# Patient Record
Sex: Male | Born: 1990
Health system: Southern US, Community
[De-identification: ages and names within clinical notes are randomized; demographics above are authoritative.]

## PROBLEM LIST (undated history)

## (undated) DIAGNOSIS — K2970 Gastritis, unspecified, without bleeding: Secondary | ICD-10-CM

## (undated) DIAGNOSIS — K859 Acute pancreatitis without necrosis or infection, unspecified: Secondary | ICD-10-CM

---
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
## 2010-11-29 ENCOUNTER — Encounter: Payer: Self-pay | Admitting: *Deleted

## 2015-04-19 ENCOUNTER — Encounter (HOSPITAL_BASED_OUTPATIENT_CLINIC_OR_DEPARTMENT_OTHER): Payer: Self-pay

## 2015-04-19 ENCOUNTER — Emergency Department (HOSPITAL_BASED_OUTPATIENT_CLINIC_OR_DEPARTMENT_OTHER)
Admission: EM | Admit: 2015-04-19 | Discharge: 2015-04-19 | Disposition: A | Discharge status: Home / Self Care | Payer: Self-pay | Attending: Emergency Medicine | Admitting: Emergency Medicine

## 2015-04-19 DIAGNOSIS — R1013 Epigastric pain: Secondary | ICD-10-CM | POA: Insufficient documentation

## 2015-04-19 DIAGNOSIS — Z720 Tobacco use: Secondary | ICD-10-CM | POA: Insufficient documentation

## 2015-04-19 DIAGNOSIS — Z8719 Personal history of other diseases of the digestive system: Secondary | ICD-10-CM | POA: Insufficient documentation

## 2015-04-19 DIAGNOSIS — R112 Nausea with vomiting, unspecified: Secondary | ICD-10-CM | POA: Insufficient documentation

## 2015-04-19 HISTORY — DX: Gastritis, unspecified, without bleeding: K29.70

## 2015-04-19 HISTORY — DX: Acute pancreatitis without necrosis or infection, unspecified: K85.90

## 2015-04-19 LAB — COMPREHENSIVE METABOLIC PANEL
ALT: 21 U/L (ref 17–63)
AST: 24 U/L (ref 15–41)
Albumin: 4.4 g/dL (ref 3.5–5.0)
Alkaline Phosphatase: 58 U/L (ref 38–126)
Anion gap: 4 — ABNORMAL LOW (ref 5–15)
BUN: 8 mg/dL (ref 6–20)
CO2: 28 mmol/L (ref 22–32)
Calcium: 9.2 mg/dL (ref 8.9–10.3)
Chloride: 106 mmol/L (ref 101–111)
Creatinine, Ser: 0.88 mg/dL (ref 0.61–1.24)
GFR calc Af Amer: >60 mL/min (ref >60)
GFR calc non Af Amer: >60 mL/min (ref >60)
Glucose, Bld: 104 mg/dL — ABNORMAL HIGH (ref 65–99)
Potassium: 3.7 mmol/L (ref 3.5–5.1)
Sodium: 138 mmol/L (ref 135–145)
Total Bilirubin: 0.6 mg/dL (ref 0.3–1.2)
Total Protein: 7.3 g/dL (ref 6.5–8.1)

## 2015-04-19 LAB — LIPASE, BLOOD: Lipase: 32 U/L (ref 22–51)

## 2015-04-19 LAB — CBC WITH DIFFERENTIAL/PLATELET
Basophils Absolute: 0 10*3/uL (ref 0.0–0.1)
Basophils Relative: 0 % (ref 0–1)
Eosinophils Absolute: 0 10*3/uL (ref 0.0–0.7)
Eosinophils Relative: 0 % (ref 0–5)
HCT: 44.4 % (ref 39.0–52.0)
Hemoglobin: 14.8 g/dL (ref 13.0–17.0)
Lymphocytes Relative: 24 % (ref 12–46)
Lymphs Abs: 1.1 10*3/uL (ref 0.7–4.0)
MCH: 31.7 pg (ref 26.0–34.0)
MCHC: 33.3 g/dL (ref 30.0–36.0)
MCV: 95.1 fL (ref 78.0–100.0)
Monocytes Absolute: 0.4 10*3/uL (ref 0.1–1.0)
Monocytes Relative: 8 % (ref 3–12)
Neutro Abs: 3.2 10*3/uL (ref 1.7–7.7)
Neutrophils Relative %: 68 % (ref 43–77)
Platelets: 210 10*3/uL (ref 150–400)
RBC: 4.67 MIL/uL (ref 4.22–5.81)
RDW: 12.2 % (ref 11.5–15.5)
WBC: 4.8 10*3/uL (ref 4.0–10.5)

## 2015-04-19 MED ORDER — ONDANSETRON 4 MG PO TBDP: ORAL_TABLET | ORAL | Status: DC

## 2015-04-19 MED ORDER — SUCRALFATE 1 G PO TABS: 1.0000 g | ORAL_TABLET | Freq: Three times a day (TID) | ORAL | Status: DC

## 2015-04-19 MED ORDER — SODIUM CHLORIDE 0.9 % IV BOLUS (SEPSIS)
1000.0000 mL | Freq: Once | INTRAVENOUS | Status: AC
  Administered 2015-04-19: 1000 mL via INTRAVENOUS

## 2015-04-19 MED ORDER — OMEPRAZOLE 20 MG PO CPDR: 20.0000 mg | DELAYED_RELEASE_CAPSULE | Freq: Every day | ORAL | Status: DC

## 2015-04-19 MED ORDER — MORPHINE SULFATE 4 MG/ML IJ SOLN
4.0000 mg | Freq: Once | INTRAMUSCULAR | Status: AC
  Administered 2015-04-19: 4 mg via INTRAVENOUS
  Filled 2015-04-19: qty 1

## 2015-04-19 MED ORDER — PANTOPRAZOLE SODIUM 40 MG IV SOLR
40.0000 mg | Freq: Once | INTRAVENOUS | Status: AC
  Administered 2015-04-19: 40 mg via INTRAVENOUS
  Filled 2015-04-19: qty 40

## 2015-04-19 MED ORDER — ONDANSETRON HCL 4 MG/2ML IJ SOLN
4.0000 mg | Freq: Once | INTRAMUSCULAR | Status: AC
  Administered 2015-04-19: 4 mg via INTRAVENOUS
  Filled 2015-04-19: qty 2

## 2015-04-19 NOTE — ED Notes (Signed)
MD at bedside to discuss results of testing. 

## 2015-04-19 NOTE — ED Notes (Signed)
MD at bedside.  Family now at bedside voicing concerns over "finding out what the problem is and why he keeps getting this".  Pt states has been seen by gi, had u/s, ct scan and endoscopy and colonoscopy and states all testing was negative.  States that he was informed people typically get this with chronic alcoholism but states he does not drink and appears offended by this previous assessment per mds.  Informed will r/o acute issues and will then need to follow back up with gi for additional testing.

## 2015-04-19 NOTE — Discharge Instructions (Signed)

## 2015-04-19 NOTE — ED Notes (Addendum)
Pt reports acute on chronic abd pain, n/v/d.  Denies fever.  Reports hx of pancreatitis, gastritis.  Reports has been seen at [facility], given meds, does not have with him currently.

## 2015-04-19 NOTE — ED Provider Notes (Signed)
CSN: [id number]     Arrival date & time 04/19/15  0902 History   First MD Initiated Contact with Patient 04/19/15 [phone]     Chief Complaint  Patient presents with  . Abdominal Pain     (Consider location/radiation/quality/duration/timing/severity/associated sxs/prior Treatment) HPI Comments: Patient presents with abdominal pain. He has a history of prior pancreatitis and gastritis. He states every couple months he has a flareup of epigastric pain. He states his pain started again this morning with constant throbbing pain in his upper abdomen. He's had associated nausea and vomiting. He states she's having normal stools. He denies any fevers or chills. His emesis is nonbloody and nonbilious. He's been evaluated extensively for this in the past. He states he's had a CT scan and endoscopy and ultrasound. He has seen a gastroenterologist in [facility]. He states he's usually been seen at [facility] for his symptoms.  Patient is a 24 y.o. male presenting with abdominal pain.  Abdominal Pain Associated symptoms: nausea and vomiting   Associated symptoms: no chest pain, no chills, no cough, no diarrhea, no fatigue, no fever, no hematuria and no shortness of breath     Past Medical History  Diagnosis Date  . Gastritis   . Pancreatitis    History reviewed. No pertinent past surgical history. No family history on file. History  Substance Use Topics  . Smoking status: Current Every Day Smoker -- 0.50 packs/day    Types: Cigarettes  . Smokeless tobacco: Not on file  . Alcohol Use: No    Review of Systems  Constitutional: Negative for fever, chills, diaphoresis and fatigue.  HENT: Negative for congestion, rhinorrhea and sneezing.   Eyes: Negative.   Respiratory: Negative for cough, chest tightness and shortness of breath.   Cardiovascular: Negative for chest pain and leg swelling.  Gastrointestinal: Positive for nausea, vomiting and abdominal pain. Negative for diarrhea and  blood in stool.  Genitourinary: Negative for frequency, hematuria, flank pain and difficulty urinating.  Musculoskeletal: Negative for back pain and arthralgias.  Skin: Negative for rash.  Neurological: Negative for dizziness, speech difficulty, weakness, numbness and headaches.      Allergies  Review of patient's allergies indicates no known allergies.  Home Medications   Prior to Admission medications   Medication Sig Start Date End Date Taking? Authorizing Provider  omeprazole (PRILOSEC) 20 MG capsule Take 1 capsule (20 mg total) by mouth daily. 04/19/15   Rolan Bucco, MD  ondansetron (ZOFRAN ODT) 4 MG disintegrating tablet 4mg  ODT q4 hours prn nausea/vomit 04/19/15   Rolan Bucco, MD  sucralfate (CARAFATE) 1 G tablet Take 1 tablet (1 g total) by mouth 4 (four) times daily -  with meals and at bedtime. 04/19/15   Rolan Bucco, MD   BP 137/49 mmHg  Pulse 77  Temp(Src) 98.4 F (36.9 C) (Oral)  Resp 16  Ht 6\' 3"  (1.905 m)  Wt 160 lb (72.576 kg)  BMI 20.00 kg/m2  SpO2 100% Physical Exam  Constitutional: He is oriented to person, place, and time. He appears well-developed and well-nourished.  HENT:  Head: Normocephalic and atraumatic.  Eyes: Pupils are equal, round, and reactive to light.  Neck: Normal range of motion. Neck supple.  Cardiovascular: Normal rate, regular rhythm and normal heart sounds.   Pulmonary/Chest: Effort normal and breath sounds normal. No respiratory distress. He has no wheezes. He has no rales. He exhibits no tenderness.  Abdominal: Soft. Bowel sounds are normal. There is tenderness (Moderate tenderness to epigastrium). There  is no rebound and no guarding.  Musculoskeletal: Normal range of motion. He exhibits no edema.  Lymphadenopathy:    He has no cervical adenopathy.  Neurological: He is alert and oriented to person, place, and time.  Skin: Skin is warm and dry. No rash noted.  Psychiatric: He has a normal mood and affect.    ED Course   Procedures (including critical care time) Labs Review Results for orders placed or performed during the hospital encounter of 04/19/15  Comprehensive metabolic panel  Result Value Ref Range   Sodium 138 135 - 145 mmol/L   Potassium 3.7 3.5 - 5.1 mmol/L   Chloride 106 101 - 111 mmol/L   CO2 28 22 - 32 mmol/L   Glucose, Bld 104 (H) 65 - 99 mg/dL   BUN 8 6 - 20 mg/dL   Creatinine, Ser 1.61 0.61 - 1.24 mg/dL   Calcium 9.2 8.9 - 09.6 mg/dL   Total Protein 7.3 6.5 - 8.1 g/dL   Albumin 4.4 3.5 - 5.0 g/dL   AST 24 15 - 41 U/L   ALT 21 17 - 63 U/L   Alkaline Phosphatase 58 38 - 126 U/L   Total Bilirubin 0.6 0.3 - 1.2 mg/dL   GFR calc non Af Amer >60 >60 mL/min   GFR calc Af Amer >60 >60 mL/min   Anion gap 4 (L) 5 - 15  CBC with Differential  Result Value Ref Range   WBC 4.8 4.0 - 10.5 K/uL   RBC 4.67 4.22 - 5.81 MIL/uL   Hemoglobin 14.8 13.0 - 17.0 g/dL   HCT 04.5 40.9 - 81.1 %   MCV 95.1 78.0 - 100.0 fL   MCH 31.7 26.0 - 34.0 pg   MCHC 33.3 30.0 - 36.0 g/dL   RDW 91.4 78.2 - 95.6 %   Platelets 210 150 - 400 K/uL   Neutrophils Relative % 68 43 - 77 %   Neutro Abs 3.2 1.7 - 7.7 K/uL   Lymphocytes Relative 24 12 - 46 %   Lymphs Abs 1.1 0.7 - 4.0 K/uL   Monocytes Relative 8 3 - 12 %   Monocytes Absolute 0.4 0.1 - 1.0 K/uL   Eosinophils Relative 0 0 - 5 %   Eosinophils Absolute 0.0 0.0 - 0.7 K/uL   Basophils Relative 0 0 - 1 %   Basophils Absolute 0.0 0.0 - 0.1 K/uL  Lipase, blood  Result Value Ref Range   Lipase 32 22 - 51 U/L   No results found.    Imaging Review No results found.   EKG Interpretation None      MDM   Final diagnoses:  Epigastric pain    Patient's labs are unremarkable. There is no evidence of acute pancreatitis. He's had a prior workup afford to assess for gallbladder disease. He's had a prior CT scan of his abdomen pelvis. I don't feel this point that further imaging studies are indicated. He has no ongoing vomiting. He was given IV fluids and  some pain medicines in the emergency department. He's feeling better and appears to be very comfortable. His abdominal exam is non-concerning. He is requesting a referral to a another gastroenterologist. I gave him an outpatient referral to look our gastroenterology. I gave him prescription for Prilosec Carafate and Zofran. Return precautions were given.    Rolan Bucco, MD 04/19/15 1047

## 2015-07-02 ENCOUNTER — Encounter (HOSPITAL_BASED_OUTPATIENT_CLINIC_OR_DEPARTMENT_OTHER): Payer: Self-pay

## 2015-07-02 ENCOUNTER — Emergency Department (HOSPITAL_BASED_OUTPATIENT_CLINIC_OR_DEPARTMENT_OTHER)
Admission: EM | Admit: 2015-07-02 | Discharge: 2015-07-02 | Disposition: A | Discharge status: Home / Self Care | Payer: Self-pay | Attending: Emergency Medicine | Admitting: Emergency Medicine

## 2015-07-02 DIAGNOSIS — R1013 Epigastric pain: Secondary | ICD-10-CM | POA: Insufficient documentation

## 2015-07-02 DIAGNOSIS — K2970 Gastritis, unspecified, without bleeding: Secondary | ICD-10-CM | POA: Insufficient documentation

## 2015-07-02 DIAGNOSIS — R112 Nausea with vomiting, unspecified: Secondary | ICD-10-CM | POA: Insufficient documentation

## 2015-07-02 DIAGNOSIS — R1012 Left upper quadrant pain: Secondary | ICD-10-CM | POA: Insufficient documentation

## 2015-07-02 DIAGNOSIS — Z79899 Other long term (current) drug therapy: Secondary | ICD-10-CM | POA: Insufficient documentation

## 2015-07-02 DIAGNOSIS — Z720 Tobacco use: Secondary | ICD-10-CM | POA: Insufficient documentation

## 2015-07-02 LAB — CBC WITH DIFFERENTIAL/PLATELET
BASOS ABS: 0 10*3/uL (ref 0.0–0.1)
Basophils Relative: 0 % (ref 0–1)
EOS ABS: 0 10*3/uL (ref 0.0–0.7)
Eosinophils Relative: 1 % (ref 0–5)
HCT: 44.7 % (ref 39.0–52.0)
Hemoglobin: 15.2 g/dL (ref 13.0–17.0)
LYMPHS PCT: 17 % (ref 12–46)
Lymphs Abs: 1.1 10*3/uL (ref 0.7–4.0)
MCH: 32.1 pg (ref 26.0–34.0)
MCHC: 34 g/dL (ref 30.0–36.0)
MCV: 94.5 fL (ref 78.0–100.0)
Monocytes Absolute: 0.4 10*3/uL (ref 0.1–1.0)
Monocytes Relative: 7 % (ref 3–12)
Neutro Abs: 4.5 10*3/uL (ref 1.7–7.7)
Neutrophils Relative %: 75 % (ref 43–77)
PLATELETS: 194 10*3/uL (ref 150–400)
RBC: 4.73 MIL/uL (ref 4.22–5.81)
RDW: 11.9 % (ref 11.5–15.5)
WBC: 6.1 10*3/uL (ref 4.0–10.5)

## 2015-07-02 LAB — COMPREHENSIVE METABOLIC PANEL
ALT: 17 U/L (ref 17–63)
AST: 19 U/L (ref 15–41)
Albumin: 4.5 g/dL (ref 3.5–5.0)
Alkaline Phosphatase: 53 U/L (ref 38–126)
Anion gap: 9 (ref 5–15)
BUN: 11 mg/dL (ref 6–20)
CHLORIDE: 101 mmol/L (ref 101–111)
CO2: 29 mmol/L (ref 22–32)
Calcium: 9.9 mg/dL (ref 8.9–10.3)
Creatinine, Ser: 0.85 mg/dL (ref 0.61–1.24)
GFR calc Af Amer: >60 mL/min (ref >60)
GFR calc non Af Amer: >60 mL/min (ref >60)
Glucose, Bld: 91 mg/dL (ref 65–99)
Potassium: 4.1 mmol/L (ref 3.5–5.1)
SODIUM: 139 mmol/L (ref 135–145)
Total Bilirubin: 1 mg/dL (ref 0.3–1.2)
Total Protein: 7.7 g/dL (ref 6.5–8.1)

## 2015-07-02 LAB — URINALYSIS, ROUTINE W REFLEX MICROSCOPIC
Bilirubin Urine: NEGATIVE
GLUCOSE, UA: NEGATIVE mg/dL
Hgb urine dipstick: NEGATIVE
Ketones, ur: 15 mg/dL — AB
LEUKOCYTES UA: NEGATIVE
Nitrite: NEGATIVE
PH: 7 (ref 5.0–8.0)
PROTEIN: NEGATIVE mg/dL
Specific Gravity, Urine: 1.024 (ref 1.005–1.030)
Urobilinogen, UA: 1 mg/dL (ref 0.0–1.0)

## 2015-07-02 LAB — LIPASE, BLOOD: Lipase: 55 U/L — ABNORMAL HIGH (ref 22–51)

## 2015-07-02 MED ORDER — SUCRALFATE 1 G PO TABS: 1.0000 g | ORAL_TABLET | Freq: Three times a day (TID) | ORAL | Status: DC

## 2015-07-02 MED ORDER — GI COCKTAIL ~~LOC~~
30.0000 mL | Freq: Once | ORAL | Status: AC
  Administered 2015-07-02: 30 mL via ORAL
  Filled 2015-07-02: qty 30

## 2015-07-02 MED ORDER — OMEPRAZOLE 20 MG PO CPDR: DELAYED_RELEASE_CAPSULE | ORAL | Status: DC

## 2015-07-02 NOTE — Discharge Instructions (Signed)
Please read and follow all provided instructions.  Your diagnoses today include:  1. Epigastric abdominal pain     Tests performed today include:  Blood counts and electrolytes  Blood tests to check liver and kidney function  Blood tests to check pancreas function  Urine test to look for infection  Vital signs. See below for your results today.   Medications prescribed:   Carafate - for stomach upset and to protect your stomach   Omeprazole (Prilosec) - stomach acid reducer  This medication can be found over-the-counter  Take any prescribed medications only as directed.  Home care instructions:   Follow any educational materials contained in this packet.  Follow-up instructions: Please follow-up with your primary care provider in the next 7 days for further evaluation of your symptoms.    Return instructions:  SEEK IMMEDIATE MEDICAL ATTENTION IF:  The pain does not go away or becomes severe   A temperature above 101F develops   Repeated vomiting occurs (multiple episodes)   The pain becomes localized to portions of the abdomen. The right side could possibly be appendicitis. In an adult, the left lower portion of the abdomen could be colitis or diverticulitis.   Blood is being passed in stools or vomit (bright red or black tarry stools)   You develop chest pain, difficulty breathing, dizziness or fainting, or become confused, poorly responsive, or inconsolable (young children)  If you have any other emergent concerns regarding your health  Additional Information: Abdominal (belly) pain can be caused by many things. Your caregiver performed an examination and possibly ordered blood/urine tests and imaging (CT scan, x-rays, ultrasound). Many cases can be observed and treated at home after initial evaluation in the emergency department. Even though you are being discharged home, abdominal pain can be unpredictable. Therefore, you need a repeated exam if your pain  does not resolve, returns, or worsens. Most patients with abdominal pain don't have to be admitted to the hospital or have surgery, but serious problems like appendicitis and gallbladder attacks can start out as nonspecific pain. Many abdominal conditions cannot be diagnosed in one visit, so follow-up evaluations are very important.  Your vital signs today were: BP 130/58 mmHg   Pulse 66   Temp(Src) 98.2 F (36.8 C) (Oral)   Resp 18   Ht  (1.88 m)   Wt 151 lb (68.493 kg)   BMI 19.38 kg/m2   SpO2 100% If your blood pressure (bp) was elevated above 135/85 this visit, please have this repeated by your doctor within one month. --------------

## 2015-07-02 NOTE — ED Notes (Signed)
C/o abd pain, vomiting since Saturday-states emesis was "black today"

## 2015-07-02 NOTE — ED Provider Notes (Signed)
CSN: [id number]     Arrival date & time 07/02/15  1200 History   First MD Initiated Contact with Patient 07/02/15 1228     Chief Complaint  Patient presents with  . Abdominal Pain     (Consider location/radiation/quality/duration/timing/severity/associated sxs/prior Treatment) HPI Comments: Patient with history of pancreatitis and gastritis presents with his usual epigastric and left upper quadrant abdominal pain consistent with previous episodes of gastritis. Patient has been seen by a gastroenterologist in [facility] in the past. He has had a colonoscopy and EGD which were unrevealing. Patient states that he has had intermittent episodes of vomiting over the past 4 days. Today his emesis was "black". Patient has been taking omeprazole, Zofran, and Carafate for his symptoms. He states that when he is feeling better he does not take the omeprazole every day. He denies any change in his stools, melanotic or black tarry stools, gross blood in stools. Pain does not radiate. Patient denies alcohol use. He avoids NSAIDs. Patient does smoke. No lightheadedness or dizziness. The onset of this condition was acute. The course is constant. Aggravating factors: none. Alleviating factors: none.    Patient is a 24 y.o. male presenting with abdominal pain. The history is provided by the patient.  Abdominal Pain Associated symptoms: nausea and vomiting   Associated symptoms: no chest pain, no cough, no diarrhea, no dysuria, no fever and no sore throat     Past Medical History  Diagnosis Date  . Gastritis   . Pancreatitis    History reviewed. No pertinent past surgical history. No family history on file. Social History  Substance Use Topics  . Smoking status: Current Every Day Smoker -- 0.50 packs/day    Types: Cigarettes  . Smokeless tobacco: None  . Alcohol Use: No    Review of Systems  Constitutional: Negative for fever.  HENT: Negative for rhinorrhea and sore throat.   Eyes: Negative for  redness.  Respiratory: Negative for cough.   Cardiovascular: Negative for chest pain.  Gastrointestinal: Positive for nausea, vomiting and abdominal pain. Negative for diarrhea and blood in stool.  Genitourinary: Negative for dysuria.  Musculoskeletal: Negative for myalgias.  Skin: Negative for rash.  Neurological: Negative for light-headedness and headaches.      Allergies  Review of patient's allergies indicates no known allergies.  Home Medications   Prior to Admission medications   Medication Sig Start Date End Date Taking? Authorizing Provider  omeprazole (PRILOSEC) 20 MG capsule Take 1 capsule (20 mg total) by mouth daily. 04/19/15   Rolan Bucco, MD  ondansetron (ZOFRAN ODT) 4 MG disintegrating tablet 4mg  ODT q4 hours prn nausea/vomit 04/19/15   Rolan Bucco, MD  sucralfate (CARAFATE) 1 G tablet Take 1 tablet (1 g total) by mouth 4 (four) times daily -  with meals and at bedtime. 04/19/15   Rolan Bucco, MD   BP 130/58 mmHg  Pulse 66  Temp(Src) 98.2 F (36.8 C) (Oral)  Resp 18  Ht 6\' 2"  (1.88 m)  Wt 151 lb (68.493 kg)  BMI 19.38 kg/m2  SpO2 100% Physical Exam  Constitutional: He appears well-developed and well-nourished.  HENT:  Head: Normocephalic and atraumatic.  Eyes: Conjunctivae are normal. Right eye exhibits no discharge. Left eye exhibits no discharge.  Neck: Normal range of motion. Neck supple.  Cardiovascular: Normal rate, regular rhythm and normal heart sounds.   Pulmonary/Chest: Effort normal and breath sounds normal.  Abdominal: Soft. Bowel sounds are normal. There is tenderness (Mild) in the epigastric area and left upper  quadrant. There is no rigidity, no rebound, no guarding, no CVA tenderness, no tenderness at McBurney's point and negative Murphy's sign.  Neurological: He is alert.  Skin: Skin is warm and dry.  Psychiatric: He has a normal mood and affect.  Nursing note and vitals reviewed.   ED Course  Procedures (including critical care  time) Labs Review Labs Reviewed  LIPASE, BLOOD - Abnormal; Notable for the following:    Lipase 55 (*)    All other components within normal limits  URINALYSIS, ROUTINE W REFLEX MICROSCOPIC (NOT AT [facility]) - Abnormal; Notable for the following:    Ketones, ur 15 (*)    All other components within normal limits  CBC WITH DIFFERENTIAL/PLATELET  COMPREHENSIVE METABOLIC PANEL    Imaging Review No results found. I have personally reviewed and evaluated these images and lab results as part of my medical decision-making.   EKG Interpretation None      1:16 PM Patient seen and examined. Work-up initiated. Medications ordered.   Vital signs reviewed and are as follows: BP 130/58 mmHg  Pulse 66  Temp(Src) 98.2 F (36.8 C) (Oral)  Resp 18  Ht  (1.88 m)  Wt 151 lb (68.493 kg)  BMI 19.38 kg/m2  SpO2 100%  2:03 PM reviewed lab findings with patient. Patient had significant relief from GI cocktail.  I encouraged the patient to take omeprazole every day regardless of whether he is feeling better or not. He is to use Carafate and Zofran as needed. Discussed smoking cessation with patient. Encouraged PCP follow-up in one week for recheck.  The patient was urged to return to the Emergency Department immediately with worsening of current symptoms, worsening abdominal pain, persistent vomiting, blood noted in stools, fever, or any other concerns. The patient verbalized understanding.    MDM   Final diagnoses:  Epigastric abdominal pain   Patient with recurring bouts of epigastric pain consistent with previous gastritis. Unclear inciting etiology. Labs today are reassuring. Symptoms resolved with GI cocktail. Patient with very mild elevation in lipase which I do not think is due to pancreatitis. Treatment as above. Do not feel that CT imaging is indicated at this time. Symptoms are not clinically suspicious for gallbladder disease. Patient appears well, nontoxic. Discussed return  instructions with patient as above.    Renne Crigler, PA-C 07/02/15 1404  Mirian Mo, MD 07/03/15 [phone]

## 2015-09-12 ENCOUNTER — Encounter (HOSPITAL_BASED_OUTPATIENT_CLINIC_OR_DEPARTMENT_OTHER): Payer: Self-pay | Admitting: *Deleted

## 2015-09-12 ENCOUNTER — Emergency Department (HOSPITAL_BASED_OUTPATIENT_CLINIC_OR_DEPARTMENT_OTHER)
Admission: EM | Admit: 2015-09-12 | Discharge: 2015-09-12 | Disposition: A | Discharge status: Home / Self Care | Payer: Self-pay | Attending: Emergency Medicine | Admitting: Emergency Medicine

## 2015-09-12 DIAGNOSIS — K2970 Gastritis, unspecified, without bleeding: Secondary | ICD-10-CM | POA: Insufficient documentation

## 2015-09-12 DIAGNOSIS — Z720 Tobacco use: Secondary | ICD-10-CM | POA: Insufficient documentation

## 2015-09-12 DIAGNOSIS — G8929 Other chronic pain: Secondary | ICD-10-CM | POA: Insufficient documentation

## 2015-09-12 LAB — COMPREHENSIVE METABOLIC PANEL
ALT: 15 U/L — ABNORMAL LOW (ref 17–63)
ANION GAP: 5 (ref 5–15)
AST: 21 U/L (ref 15–41)
Albumin: 4.4 g/dL (ref 3.5–5.0)
Alkaline Phosphatase: 55 U/L (ref 38–126)
BILIRUBIN TOTAL: 0.7 mg/dL (ref 0.3–1.2)
BUN: 12 mg/dL (ref 6–20)
CHLORIDE: 100 mmol/L — AB (ref 101–111)
CO2: 29 mmol/L (ref 22–32)
Calcium: 9.3 mg/dL (ref 8.9–10.3)
Creatinine, Ser: 0.75 mg/dL (ref 0.61–1.24)
GFR calc Af Amer: >60 mL/min (ref >60)
Glucose, Bld: 95 mg/dL (ref 65–99)
Potassium: 4.1 mmol/L (ref 3.5–5.1)
Sodium: 134 mmol/L — ABNORMAL LOW (ref 135–145)
TOTAL PROTEIN: 7.5 g/dL (ref 6.5–8.1)

## 2015-09-12 LAB — CBC WITH DIFFERENTIAL/PLATELET
BASOS PCT: 0 %
Basophils Absolute: 0 10*3/uL (ref 0.0–0.1)
EOS PCT: 1 %
Eosinophils Absolute: 0 10*3/uL (ref 0.0–0.7)
HCT: 42 % (ref 39.0–52.0)
Hemoglobin: 14.3 g/dL (ref 13.0–17.0)
LYMPHS PCT: 26 %
Lymphs Abs: 1.5 10*3/uL (ref 0.7–4.0)
MCH: 31.8 pg (ref 26.0–34.0)
MCHC: 34 g/dL (ref 30.0–36.0)
MCV: 93.3 fL (ref 78.0–100.0)
Monocytes Absolute: 0.6 10*3/uL (ref 0.1–1.0)
Monocytes Relative: 10 %
Neutro Abs: 3.7 10*3/uL (ref 1.7–7.7)
Neutrophils Relative %: 63 %
PLATELETS: 235 10*3/uL (ref 150–400)
RBC: 4.5 MIL/uL (ref 4.22–5.81)
RDW: 12.5 % (ref 11.5–15.5)
WBC: 5.8 10*3/uL (ref 4.0–10.5)

## 2015-09-12 LAB — URINALYSIS, ROUTINE W REFLEX MICROSCOPIC
Bilirubin Urine: NEGATIVE
Glucose, UA: NEGATIVE mg/dL
HGB URINE DIPSTICK: NEGATIVE
KETONES UR: 15 mg/dL — AB
Leukocytes, UA: NEGATIVE
NITRITE: NEGATIVE
PH: 7 (ref 5.0–8.0)
PROTEIN: NEGATIVE mg/dL
Specific Gravity, Urine: 1.024 (ref 1.005–1.030)
Urobilinogen, UA: 1 mg/dL (ref 0.0–1.0)

## 2015-09-12 LAB — LIPASE, BLOOD: Lipase: 83 U/L — ABNORMAL HIGH (ref 11–51)

## 2015-09-12 MED ORDER — OMEPRAZOLE 20 MG PO CPDR: 20.0000 mg | DELAYED_RELEASE_CAPSULE | Freq: Two times a day (BID) | ORAL | Status: DC

## 2015-09-12 MED ORDER — GI COCKTAIL ~~LOC~~
30.0000 mL | Freq: Once | ORAL | Status: AC
  Administered 2015-09-12: 30 mL via ORAL
  Filled 2015-09-12: qty 30

## 2015-09-12 MED ORDER — ONDANSETRON 4 MG PO TBDP: 4.0000 mg | ORAL_TABLET | Freq: Three times a day (TID) | ORAL | Status: DC | PRN

## 2015-09-12 MED ORDER — SUCRALFATE 1 GM/10ML PO SUSP: 1.0000 g | Freq: Three times a day (TID) | ORAL | Status: DC

## 2015-09-12 MED ORDER — ONDANSETRON 4 MG PO TBDP
4.0000 mg | ORAL_TABLET | Freq: Once | ORAL | Status: AC
  Administered 2015-09-12: 4 mg via ORAL
  Filled 2015-09-12: qty 1

## 2015-09-12 NOTE — ED Notes (Signed)
Patient verbalizes understanding of discharge instructions and medications.  Patient stable and ambulatory.

## 2015-09-12 NOTE — ED Provider Notes (Signed)
CSN: [id number]     Arrival date & time 09/12/15  1353 History   First MD Initiated Contact with Patient 09/12/15 1551     Chief Complaint  Patient presents with  . Abdominal Pain    HPI   24 year old male with a history of pancreatitis and gastritis presents today with typical epigastric pain, nausea, vomiting. Patient reports he's had this for "many years" with significant workup including gastroenterology with endoscopy, colonoscopy, H. pylori testing, CT and ultrasounds. She reports he continues to be diagnosed with gastritis, as they continued to find no abnormalities on any further diagnostic studies. Patient reports current symptoms have been present for the last 4 days with nausea, vomiting, and no appetite. Patient reports symptoms are made worse with food, he is been unable to tolerate solid foods, sticking to a liquid diet since the onset. Patient reports symptoms usually presented in similar fashion, he seen in the emergency room given a GI cocktail which seems to improve symptoms which she is able to manage on the outpatient basis. These keep reoccurring, he denies any drug, alcohol, ibuprofen use, high fatty foods or abnormal diets, or any other precipitating factors. Patient denies any dark or bloody stools, lower abdominal pain, fever, chills, altered mental status, chest pain, back pain, changes in the color clarity or characteristics of his urine or bowel movements. Patient reports that he has oral Zofran at home but is unable to take it because it causes him to throw up. He also has Carafate which does the same.  Past Medical History  Diagnosis Date  . Gastritis   . Pancreatitis    History reviewed. No pertinent past surgical history. No family history on file. Social History  Substance Use Topics  . Smoking status: Current Every Day Smoker -- 0.50 packs/day    Types: Cigarettes  . Smokeless tobacco: None  . Alcohol Use: No    Review of Systems  All other systems  reviewed and are negative.     Allergies  Review of patient's allergies indicates no known allergies.  Home Medications   Prior to Admission medications   Medication Sig Start Date End Date Taking? Authorizing Provider  omeprazole (PRILOSEC) 20 MG capsule Take 1 capsule (20 mg total) by mouth 2 (two) times daily before a meal. 09/12/15   Eyvonne Mechanic, PA-C  ondansetron (ZOFRAN ODT) 4 MG disintegrating tablet Take 1 tablet (4 mg total) by mouth every 8 (eight) hours as needed for nausea or vomiting. 09/12/15   Eyvonne Mechanic, PA-C  sucralfate (CARAFATE) 1 GM/10ML suspension Take 10 mLs (1 g total) by mouth 4 (four) times daily -  with meals and at bedtime. 09/12/15   Amario Longmore, PA-C   BP 128/72 mmHg  Pulse 62  Temp(Src) 97.7 F (36.5 C) (Oral)  Resp 18  Ht  (1.905 m)  Wt 151 lb (68.493 kg)  BMI 18.87 kg/m2  SpO2 99%   Physical Exam  Constitutional: He is oriented to person, place, and time. He appears well-developed and well-nourished.  HENT:  Head: Normocephalic and atraumatic.  Eyes: Conjunctivae are normal. Pupils are equal, round, and reactive to light. Right eye exhibits no discharge. Left eye exhibits no discharge. No scleral icterus.  Neck: Normal range of motion. No JVD present. No tracheal deviation present.  Pulmonary/Chest: Effort normal. No stridor.  Abdominal: Soft. Bowel sounds are normal. He exhibits no distension and no mass. There is tenderness. There is no rebound and no guarding.  Tenderness to the epigastric region  Neurological: He is alert and oriented to person, place, and time. Coordination normal.  Psychiatric: He has a normal mood and affect. His behavior is normal. Judgment and thought content normal.  Nursing note and vitals reviewed.   ED Course  Procedures (including critical care time) Labs Review Labs Reviewed  URINALYSIS, ROUTINE W REFLEX MICROSCOPIC (NOT AT [facility]) - Abnormal; Notable for the following:    Ketones, ur 15 (*)    All  other components within normal limits  COMPREHENSIVE METABOLIC PANEL - Abnormal; Notable for the following:    Sodium 134 (*)    Chloride 100 (*)    ALT 15 (*)    All other components within normal limits  LIPASE, BLOOD - Abnormal; Notable for the following:    Lipase 83 (*)    All other components within normal limits  CBC WITH DIFFERENTIAL/PLATELET    Imaging Review No results found. I have personally reviewed and evaluated these images and lab results as part of my medical decision-making.   EKG Interpretation None      MDM   Final diagnoses:  Gastritis    Labs: CBC, CMP, urinalysis, lipase- no significant findings  Imaging:  Consults:  Therapeutics: Zofran, GI cocktail  Discharge Meds: ODT Zofran  Assessment/Plan: Patient presents with chronic abdominal pain. Patient is having a flare today, no significant findings on labs that would indicate acute pancreatitis, patient's pain was significantly improved with GI cocktail here in the ED. Patient has had significant workup for this in the past, he will be encouraged follow-up with his GI specialist for further evaluation and management. He was given oral Zofran for home along with oral Carafate and omeprazole. Patient encouraged follow-up as soon as possible for further evaluation and management, he was given strict return precautions, verbalized understanding and agreement plan had no further questions or concerns at the time of discharge         Eyvonne MechanicJeffrey Khamari Sheehan, PA-C 09/12/15 2210  Geoffery Lyonsouglas Delo, MD 09/13/15 1252

## 2015-09-12 NOTE — ED Notes (Signed)
Abdominal pain and vomiting x 4 days. No appetite.

## 2015-09-12 NOTE — Discharge Instructions (Signed)
Gastritis, Adult Gastritis is soreness and puffiness (inflammation) of the lining of the stomach. If you do not get help, gastritis can cause bleeding and sores (ulcers) in the stomach. HOME CARE   Only take medicine as told by your doctor.  If you were given antibiotic medicines, take them as told. Finish the medicines even if you start to feel better.  Drink enough fluids to keep your pee (urine) clear or pale yellow.  Avoid foods and drinks that make your problems worse. Foods you may want to avoid include:  Caffeine or alcohol.  Chocolate.  Mint.  Garlic and onions.  Spicy foods.  Citrus fruits, including oranges, lemons, or limes.  Food containing tomatoes, including sauce, chili, salsa, and pizza.  Fried and fatty foods.  Eat small meals throughout the day instead of large meals. GET HELP RIGHT AWAY IF:   You have black or dark red poop (stools).  You throw up (vomit) blood. It may look like coffee grounds.  You cannot keep fluids down.  Your belly (abdominal) pain gets worse.  You have a fever.  You do not feel better after 1 week.  You have any other questions or concerns. MAKE SURE YOU:   Understand these instructions.  Will watch your condition.  Will get help right away if you are not doing well or get worse.   This information is not intended to replace advice given to you by your health care provider. Make sure you discuss any questions you have with your health care provider.   Document Released: 04/13/2008 Document Revised: 01/18/2012 Document Reviewed: 12/09/2011 Elsevier Interactive Patient Education 2016 [employer]  Please read attached information. If you experience any new or worsening signs or symptoms please return to the emergency room for evaluation. Please follow-up with your primary care provider or specialist as discussed. Please use medication prescribed only as directed and discontinue taking if you have any concerning signs or  symptoms.

## 2015-09-12 NOTE — ED Notes (Signed)
Pt's family member stepped out of the room appearing very frustrated and asking to speak to the pt's nurse. She expressed concern that they were not being seen by a provider fast enough. Hedges, PA then stepped to bedside and continued conversation with the visitor and pt in the room.

## 2015-09-20 ENCOUNTER — Telehealth: Payer: Self-pay | Admitting: *Deleted

## 2015-09-20 NOTE — Telephone Encounter (Signed)
Pharmacy called related to Rx: sucralfate (CARAFATE) 1 GM/10ML suspension .Marland Kitchen.Marland Kitchen.NCM clarified with EDP to change Rx to tablets for mor affordable pricing.

## 2017-07-15 ENCOUNTER — Emergency Department (HOSPITAL_BASED_OUTPATIENT_CLINIC_OR_DEPARTMENT_OTHER)
Admission: EM | Admit: 2017-07-15 | Discharge: 2017-07-15 | Disposition: A | Discharge status: Home / Self Care | Payer: Self-pay | Attending: Emergency Medicine | Admitting: Emergency Medicine

## 2017-07-15 ENCOUNTER — Encounter (HOSPITAL_BASED_OUTPATIENT_CLINIC_OR_DEPARTMENT_OTHER): Payer: Self-pay | Admitting: *Deleted

## 2017-07-15 DIAGNOSIS — F17210 Nicotine dependence, cigarettes, uncomplicated: Secondary | ICD-10-CM | POA: Insufficient documentation

## 2017-07-15 DIAGNOSIS — R1013 Epigastric pain: Secondary | ICD-10-CM | POA: Insufficient documentation

## 2017-07-15 LAB — CBC WITH DIFFERENTIAL/PLATELET
Basophils Absolute: 0 10*3/uL (ref 0.0–0.1)
Basophils Relative: 0 %
Eosinophils Absolute: 0.1 10*3/uL (ref 0.0–0.7)
Eosinophils Relative: 1 %
HCT: 43.8 % (ref 39.0–52.0)
Hemoglobin: 14.2 g/dL (ref 13.0–17.0)
Lymphocytes Relative: 23 %
Lymphs Abs: 1.4 10*3/uL (ref 0.7–4.0)
MCH: 30.2 pg (ref 26.0–34.0)
MCHC: 32.4 g/dL (ref 30.0–36.0)
MCV: 93.2 fL (ref 78.0–100.0)
Monocytes Absolute: 0.5 10*3/uL (ref 0.1–1.0)
Monocytes Relative: 8 %
Neutro Abs: 4.1 10*3/uL (ref 1.7–7.7)
Neutrophils Relative %: 68 %
Platelets: 202 10*3/uL (ref 150–400)
RBC: 4.7 MIL/uL (ref 4.22–5.81)
RDW: 13.3 % (ref 11.5–15.5)
WBC: 6 10*3/uL (ref 4.0–10.5)

## 2017-07-15 LAB — COMPREHENSIVE METABOLIC PANEL
ALT: 11 U/L — ABNORMAL LOW (ref 17–63)
AST: 14 U/L — ABNORMAL LOW (ref 15–41)
Albumin: 3.7 g/dL (ref 3.5–5.0)
Alkaline Phosphatase: 52 U/L (ref 38–126)
Anion gap: 6 (ref 5–15)
BUN: 9 mg/dL (ref 6–20)
CO2: 30 mmol/L (ref 22–32)
Calcium: 9 mg/dL (ref 8.9–10.3)
Chloride: 102 mmol/L (ref 101–111)
Creatinine, Ser: 0.74 mg/dL (ref 0.61–1.24)
GFR calc Af Amer: >60 mL/min (ref >60)
GFR calc non Af Amer: >60 mL/min (ref >60)
Glucose, Bld: 94 mg/dL (ref 65–99)
Potassium: 3.8 mmol/L (ref 3.5–5.1)
Sodium: 138 mmol/L (ref 135–145)
Total Bilirubin: 0.4 mg/dL (ref 0.3–1.2)
Total Protein: 6.6 g/dL (ref 6.5–8.1)

## 2017-07-15 LAB — URINALYSIS, ROUTINE W REFLEX MICROSCOPIC
Bilirubin Urine: NEGATIVE
Glucose, UA: NEGATIVE mg/dL
Hgb urine dipstick: NEGATIVE
Ketones, ur: NEGATIVE mg/dL
Leukocytes, UA: NEGATIVE
Nitrite: NEGATIVE
Protein, ur: NEGATIVE mg/dL
Specific Gravity, Urine: 1.01 (ref 1.005–1.030)
pH: 8.5 — ABNORMAL HIGH (ref 5.0–8.0)

## 2017-07-15 LAB — LIPASE, BLOOD: Lipase: 93 U/L — ABNORMAL HIGH (ref 11–51)

## 2017-07-15 MED ORDER — ONDANSETRON HCL 4 MG PO TABS: 4.0000 mg | ORAL_TABLET | Freq: Three times a day (TID) | ORAL | 0 refills | Status: DC | PRN

## 2017-07-15 MED ORDER — SUCRALFATE 1 G PO TABS: 1.0000 g | ORAL_TABLET | Freq: Three times a day (TID) | ORAL | 0 refills | Status: DC

## 2017-07-15 MED ORDER — GI COCKTAIL ~~LOC~~
30.0000 mL | Freq: Once | ORAL | Status: AC
  Administered 2017-07-15: 30 mL via ORAL
  Filled 2017-07-15: qty 30

## 2017-07-15 MED ORDER — FAMOTIDINE IN NACL 20-0.9 MG/50ML-% IV SOLN
20.0000 mg | Freq: Once | INTRAVENOUS | Status: AC
  Administered 2017-07-15: 20 mg via INTRAVENOUS
  Filled 2017-07-15: qty 50

## 2017-07-15 MED ORDER — ONDANSETRON HCL 4 MG/2ML IJ SOLN
4.0000 mg | Freq: Once | INTRAMUSCULAR | Status: AC
  Administered 2017-07-15: 4 mg via INTRAVENOUS
  Filled 2017-07-15: qty 2

## 2017-07-15 MED ORDER — SODIUM CHLORIDE 0.9 % IV BOLUS (SEPSIS)
1000.0000 mL | Freq: Once | INTRAVENOUS | Status: AC
  Administered 2017-07-15: 1000 mL via INTRAVENOUS

## 2017-07-15 MED ORDER — OMEPRAZOLE 20 MG PO CPDR: 20.0000 mg | DELAYED_RELEASE_CAPSULE | Freq: Two times a day (BID) | ORAL | 0 refills | Status: DC

## 2017-07-15 MED ORDER — CAPSAICIN-MENTHOL-METHYL SAL 0.025-1-12 % EX CREA: 1.0000 g | TOPICAL_CREAM | CUTANEOUS | 0 refills | Status: DC | PRN

## 2017-07-15 MED FILL — CAPZASIN-HP 0.1% CREAM: 0.1 | 30 days supply | Qty: 43 | Fill #0

## 2017-07-15 MED FILL — OMEPRAZOLE 20 MG CAP: 20 | 14 days supply | Qty: 28 | Fill #0

## 2017-07-15 MED FILL — ONDANSETRON HCL 4 MG TABLET: 4 | 6 days supply | Qty: 20 | Fill #0

## 2017-07-15 MED FILL — SUCRALFATE 1 GM TABLET: 1 | 10 days supply | Qty: 40 | Fill #0

## 2017-07-15 NOTE — Discharge Instructions (Signed)
Resumed taking your medications as prescribed. He may take Zofran 20 minutes prior to eating as needed for nausea and vomiting. Apply capsaicin cream to the abdomen once daily or as needed for nausea and vomiting. Follow up with a primary care physician for reevaluation of your symptoms. Return to the ED immediately if any concerning signs or symptoms develop.

## 2017-07-15 NOTE — ED Triage Notes (Signed)
Abdominal pain x 2 weeks. Campy pain. Vomiting everyday. Feels like he has with pancreatitis and gastritis.

## 2017-07-15 NOTE — ED Provider Notes (Signed)
[facility]   CSN: [id number] Arrival date & time: 07/15/17  1104     History   Chief Complaint Chief Complaint  Patient presents with  . Abdominal Pain    HPI Brian Tate is a 26 y.o. male with history of gastritis and pancreatitis who presents today with chief complaint acute onset, gradually worsening abdominal pain intermittently for 2 weeks, with acute worsening 3 days ago. He states that this feels very similarly to his prior bouts of gastritis. Pain is primarily in the epigastric region and is described as a burning and cramping sensation with intermittent radiation to the back. He states that for the past 3 days, he has not been able to keep food or drink down with multiple episodes of NBNB emesis over the past few days. He states he has not been able to keep any food down. He usually takes Zofran, Carafate, and an OTC GERD medication, but states he has not tried anything for his symptoms over the past 2 weeks. He states he smokes 1 pack of cigarettes daily, smokes marijuana multiple times daily, but does not drink alcohol or use any other recreational drugs. 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 He denies fevers, chills, diarrhea, constipation, melena, hematochezia, or urinary symptoms. No chest pain or shortness of breath.  Of note, he has been seen by multiple specialists and primary care physicians for evaluation of these symptoms and he has been told that his symptoms are usually due to gastritis. He has had multiple imaging studies which she states have all been negative.  The history is provided by the patient.    Past Medical History:  Diagnosis Date  . Gastritis   . Pancreatitis     There are no active problems to display for this patient.   History reviewed. No pertinent surgical  history.     Home Medications    Prior to Admission medications   Medication Sig Start Date End Date Taking? Authorizing Provider  Capsaicin-Menthol-Methyl Sal (CAPSAICIN-METHYL SAL-MENTHOL) 0.025-1-12 % CREA Apply 1 g topically as needed (nausea and vomiting). 07/15/17   Luevenia Maxin, Amareon Phung A, PA-C  omeprazole (PRILOSEC) 20 MG capsule Take 1 capsule (20 mg total) by mouth 2 (two) times daily before a meal. 07/15/17 07/29/17  Ayaansh Smail A, PA-C  ondansetron (ZOFRAN ODT) 4 MG disintegrating tablet Take 1 tablet (4 mg total) by mouth every 8 (eight) hours as needed for nausea or vomiting. 09/12/15   Hedges, Tinnie Gens, PA-C  ondansetron (ZOFRAN) 4 MG tablet Take 1 tablet (4 mg total) by mouth every 8 (eight) hours as needed for nausea or vomiting. 07/15/17   Luevenia Maxin, Lear Carstens A, PA-C  sucralfate (CARAFATE) 1 g tablet Take 1 tablet (1 g total) by mouth 4 (four) times daily -  with meals and at bedtime. 07/15/17   Jeanie Sewer, PA-C    Family History No family history on file.  Social History Social History  Substance Use Topics  . Smoking status: Current Every Day Smoker    Packs/day: 0.50    Types: Cigarettes  . Smokeless tobacco: Never Used  . Alcohol use No     Allergies   Patient has no known allergies.   Review of Systems Review of Systems  Constitutional: Positive for chills. Negative for fever.  Respiratory: Negative for shortness of breath.   Cardiovascular: Negative for chest pain.  Gastrointestinal: Positive for abdominal pain, nausea and vomiting. Negative for blood in stool, constipation and diarrhea.  Genitourinary: Negative for dysuria, hematuria and urgency.  All other systems reviewed and are negative.    Physical Exam Updated Vital Signs BP 109/62 (BP Location: Left Arm)   Pulse (!) 58   Temp 98.6 F (37 C) (Oral)   Resp 18   Ht 6\' 2"  (1.88 m)   Wt 69.6 kg (153 lb 7 oz)   SpO2 100%   BMI 19.70 kg/m   Physical Exam  Constitutional: He appears well-developed and  well-nourished. No distress.  Resting comfortably in bed  HENT:  Head: Normocephalic and atraumatic.  Eyes: Pupils are equal, round, and reactive to light. Conjunctivae and EOM are normal. Right eye exhibits no discharge. Left eye exhibits no discharge.  Neck: Normal range of motion. Neck supple. No JVD present. No tracheal deviation present.  Cardiovascular: Normal rate, regular rhythm and normal heart sounds.   Pulmonary/Chest: Effort normal and breath sounds normal. No respiratory distress. He has no wheezes. He has no rales. He exhibits no tenderness.  Abdominal: Soft. He exhibits no distension. There is tenderness.  Hypoactive bowel sounds, TTP in the epigastric region. Murphy's sign absent, rovsing's absent, no ttp at McBurney's point, no CVA tenderness  Musculoskeletal: He exhibits no edema.  Neurological: He is alert.  Skin: Skin is warm and dry. No erythema.  Psychiatric: He has a normal mood and affect. His behavior is normal.  Nursing note and vitals reviewed.    ED Treatments / Results  Labs (all labs ordered are listed, but only abnormal results are displayed) Labs Reviewed  COMPREHENSIVE METABOLIC PANEL - Abnormal; Notable for the following:       Result Value   AST 14 (*)    ALT 11 (*)    All other components within normal limits  LIPASE, BLOOD - Abnormal; Notable for the following:    Lipase 93 (*)    All other components within normal limits  URINALYSIS, ROUTINE W REFLEX MICROSCOPIC - Abnormal; Notable for the following:    APPearance CLOUDY (*)    pH 8.5 (*)    All other components within normal limits  CBC WITH DIFFERENTIAL/PLATELET    EKG  EKG Interpretation None       Radiology No results found.  Procedures Procedures (including critical care time)  Medications Ordered in ED Medications  gi cocktail (Maalox,Lidocaine,Donnatal) (30 mLs Oral Given 07/15/17 1126)  sodium chloride 0.9 % bolus 1,000 mL (1,000 mLs Intravenous New Bag/Given 07/15/17  1147)  ondansetron (ZOFRAN) injection 4 mg (4 mg Intravenous Given 07/15/17 1147)  famotidine (PEPCID) IVPB 20 mg premix (0 mg Intravenous Stopped 07/15/17 1226)     Initial Impression / Assessment and Plan / ED Course  I have reviewed the triage vital signs and the nursing notes.  Pertinent labs & imaging results that were available during my care of the patient were reviewed by me and considered in my medical decision making (see chart for details).     Patient with epigastric pain, nausea, and vomiting. States it is similar to his usual gastritis. Afebrile, vital signs are stable, and he is well-appearing in no apparent distress. Lipase is slightly elevated at 93, but not concerning for pancreatitis. Remainder of lab work is unremarkable. No dizziness imaging right at this time. I doubt appendicitis, colitis, perforated viscus, or acute surgical pathology. On reevaluation, he  states he is feeling much better after the administration of a fluid bolus, GI cocktail, Zofran, and Pepcid. He is tolerating by mouth food and fluids while in the ED with no vomiting. Repeat abdominal examination is unremarkable. Stable for discharge home with refills of his usual medications. He is also requesting capsaicin cream, and there may be a component of hyperemesis cannabinoid to his symptoms as he is a heavy marijuana user. He will follow up with his primary care physician for reevaluation. Discussed indications for return to the ED. Pt verbalized understanding of and agreement with plan and is safe for discharge home at this time.  Final Clinical Impressions(s) / ED Diagnoses   Final diagnoses:  Epigastric pain    New Prescriptions New Prescriptions   CAPSAICIN-MENTHOL-METHYL SAL (CAPSAICIN-METHYL SAL-MENTHOL) 0.025-1-12 % CREA    Apply 1 g topically as needed (nausea and vomiting).   OMEPRAZOLE (PRILOSEC) 20 MG CAPSULE    Take 1 capsule (20 mg total) by mouth 2 (two) times daily before a meal.   ONDANSETRON  (ZOFRAN) 4 MG TABLET    Take 1 tablet (4 mg total) by mouth every 8 (eight) hours as needed for nausea or vomiting.   SUCRALFATE (CARAFATE) 1 G TABLET    Take 1 tablet (1 g total) by mouth 4 (four) times daily -  with meals and at bedtime.     Jeanie Sewer, PA-C 07/15/17 1441    Rolan Bucco, MD 07/15/17 1452

## 2017-07-15 NOTE — ED Notes (Signed)
Pt tolerated ginger ale and crackers. Pt reports he is feeling better.

## 2017-11-24 ENCOUNTER — Encounter (HOSPITAL_COMMUNITY): Payer: Self-pay | Admitting: Emergency Medicine

## 2017-11-24 ENCOUNTER — Emergency Department (HOSPITAL_COMMUNITY)
Admission: EM | Admit: 2017-11-24 | Discharge: 2017-11-24 | Disposition: A | Discharge status: Home / Self Care | Payer: Self-pay | Attending: Emergency Medicine | Admitting: Emergency Medicine

## 2017-11-24 DIAGNOSIS — R1013 Epigastric pain: Secondary | ICD-10-CM | POA: Insufficient documentation

## 2017-11-24 DIAGNOSIS — R197 Diarrhea, unspecified: Secondary | ICD-10-CM | POA: Insufficient documentation

## 2017-11-24 DIAGNOSIS — F17210 Nicotine dependence, cigarettes, uncomplicated: Secondary | ICD-10-CM | POA: Insufficient documentation

## 2017-11-24 DIAGNOSIS — R112 Nausea with vomiting, unspecified: Secondary | ICD-10-CM | POA: Insufficient documentation

## 2017-11-24 LAB — URINALYSIS, ROUTINE W REFLEX MICROSCOPIC
Bilirubin Urine: NEGATIVE
GLUCOSE, UA: NEGATIVE mg/dL
HGB URINE DIPSTICK: NEGATIVE
Ketones, ur: 20 mg/dL — AB
Leukocytes, UA: NEGATIVE
Nitrite: NEGATIVE
Protein, ur: NEGATIVE mg/dL
SPECIFIC GRAVITY, URINE: 1.027 (ref 1.005–1.030)
pH: 6 (ref 5.0–8.0)

## 2017-11-24 LAB — CBC
HEMATOCRIT: 42.9 % (ref 39.0–52.0)
HEMOGLOBIN: 14.3 g/dL (ref 13.0–17.0)
MCH: 31.4 pg (ref 26.0–34.0)
MCHC: 33.3 g/dL (ref 30.0–36.0)
MCV: 94.1 fL (ref 78.0–100.0)
Platelets: 239 10*3/uL (ref 150–400)
RBC: 4.56 MIL/uL (ref 4.22–5.81)
RDW: 12.8 % (ref 11.5–15.5)
WBC: 8.1 10*3/uL (ref 4.0–10.5)

## 2017-11-24 LAB — COMPREHENSIVE METABOLIC PANEL
ALT: 14 U/L — AB (ref 17–63)
ANION GAP: 6 (ref 5–15)
AST: 25 U/L (ref 15–41)
Albumin: 4.4 g/dL (ref 3.5–5.0)
Alkaline Phosphatase: 60 U/L (ref 38–126)
BUN: 13 mg/dL (ref 6–20)
CHLORIDE: 104 mmol/L (ref 101–111)
CO2: 28 mmol/L (ref 22–32)
Calcium: 9.4 mg/dL (ref 8.9–10.3)
Creatinine, Ser: 0.82 mg/dL (ref 0.61–1.24)
GFR calc non Af Amer: >60 mL/min (ref >60)
Glucose, Bld: 105 mg/dL — ABNORMAL HIGH (ref 65–99)
Potassium: 3.9 mmol/L (ref 3.5–5.1)
Sodium: 138 mmol/L (ref 135–145)
Total Bilirubin: 1.1 mg/dL (ref 0.3–1.2)
Total Protein: 7.3 g/dL (ref 6.5–8.1)

## 2017-11-24 LAB — LIPASE, BLOOD: LIPASE: 57 U/L — AB (ref 11–51)

## 2017-11-24 MED ORDER — OMEPRAZOLE 40 MG PO CPDR: 40.0000 mg | DELAYED_RELEASE_CAPSULE | Freq: Every day | ORAL | 0 refills | Status: DC

## 2017-11-24 MED ORDER — FAMOTIDINE 20 MG PO TABS
40.0000 mg | ORAL_TABLET | Freq: Once | ORAL | Status: AC
  Administered 2017-11-24: 40 mg via ORAL
  Filled 2017-11-24: qty 2

## 2017-11-24 MED ORDER — DICYCLOMINE HCL 10 MG PO CAPS
10.0000 mg | ORAL_CAPSULE | Freq: Once | ORAL | Status: AC
  Administered 2017-11-24: 10 mg via ORAL
  Filled 2017-11-24: qty 1

## 2017-11-24 MED ORDER — DICYCLOMINE HCL 20 MG PO TABS: 20.0000 mg | ORAL_TABLET | Freq: Three times a day (TID) | ORAL | 0 refills | Status: DC | PRN

## 2017-11-24 MED ORDER — ONDANSETRON HCL 4 MG/2ML IJ SOLN
4.0000 mg | Freq: Once | INTRAMUSCULAR | Status: AC
  Administered 2017-11-24: 4 mg via INTRAVENOUS
  Filled 2017-11-24: qty 2

## 2017-11-24 MED ORDER — GI COCKTAIL ~~LOC~~
30.0000 mL | Freq: Once | ORAL | Status: AC
  Administered 2017-11-24: 30 mL via ORAL
  Filled 2017-11-24: qty 30

## 2017-11-24 NOTE — ED Triage Notes (Signed)
Pt comes [place] via [place] with complaints of generalized abdominal pain.  Hx of gastritis and pancreatitis. A&Ox4. Ambulatory. Vitals WNL. CBG 89. States he has had nausea, vomiting, and diarrhea for the past week.

## 2017-11-24 NOTE — ED Triage Notes (Signed)
Patient c/o abd pain that radiates to back for about week with vomiting. Pt PMH pancreatitis and gastritis.

## 2017-11-24 NOTE — ED Provider Notes (Signed)
[facility]-EMERGENCY DEPT Provider Note   CSN: [id number] Arrival date & time: 11/24/17  1320     History   Chief Complaint Chief Complaint  Patient presents with  . Abdominal Pain  . Emesis    HPI Brian Tate is a 27 y.o. male.  27 year old male who presents with abdominal pain, vomiting, and diarrhea.  Patient reports a one-week history of nausea, vomiting, and diarrhea associated with a 1.5-week history of intermittent upper abdominal pain.  He states that he has had a long-standing history of pancreatitis and gastritis and this pain is similar.  He will go several months with no issues and then his symptoms will flareup again for unclear reasons.  He states that he has avoided spicy and greasy foods and does not drink alcohol.  He has taken Zofran, Carafate, and PPI in the past which does not seem to improve his symptoms although he reports that currently he is out of all of those medications except for the Zofran.  He denies any urinary symptoms.  He states that he has had an extensive workup for this in the past including gastroenterology evaluation, scope, and CT scans and he does not understand why he continues to have these problems intermittently.  He has not seen a gastroenterologist in a while.  He does report daily marijuana use and states that sometimes his abdominal cramping gets better with hot showers.   The history is provided by the patient.  Abdominal Pain   Associated symptoms include vomiting.  Emesis   Associated symptoms include abdominal pain.    Past Medical History:  Diagnosis Date  . Gastritis   . Pancreatitis     There are no active problems to display for this patient.   History reviewed. No pertinent surgical history.     Home Medications    Prior to Admission medications   Medication Sig Start Date End Date Taking? Authorizing Provider  Capsaicin-Menthol-Methyl Sal (CAPSAICIN-METHYL SAL-MENTHOL) 0.025-1-12 %  CREA Apply 1 g topically as needed (nausea and vomiting). 07/15/17  Yes Fawze, Mina A, PA-C  ondansetron (ZOFRAN) 4 MG tablet Take 1 tablet (4 mg total) by mouth every 8 (eight) hours as needed for nausea or vomiting. 07/15/17  Yes Fawze, Mina A, PA-C  sucralfate (CARAFATE) 1 g tablet Take 1 tablet (1 g total) by mouth 4 (four) times daily -  with meals and at bedtime. 07/15/17  Yes Fawze, Mina A, PA-C  dicyclomine (BENTYL) 20 MG tablet Take 1 tablet (20 mg total) by mouth 3 (three) times daily as needed for spasms. 11/24/17   Little, Ambrose Finland, MD  omeprazole (PRILOSEC) 40 MG capsule Take 1 capsule (40 mg total) by mouth daily. 11/24/17   Little, Ambrose Finland, MD  ondansetron (ZOFRAN ODT) 4 MG disintegrating tablet Take 1 tablet (4 mg total) by mouth every 8 (eight) hours as needed for nausea or vomiting. 09/12/15   Eyvonne Mechanic, PA-C    Family History No family history on file.  Social History Social History   Tobacco Use  . Smoking status: Current Every Day Smoker    Packs/day: 0.50    Types: Cigarettes  . Smokeless tobacco: Never Used  Substance Use Topics  . Alcohol use: No  . Drug use: Yes    Types: Marijuana    Comment: daily     Allergies   Patient has no known allergies.   Review of Systems Review of Systems  Gastrointestinal: Positive for abdominal pain and vomiting.  All other systems reviewed and are negative except that which was mentioned in HPI   Physical Exam Updated Vital Signs BP 132/77 (BP Location: Right Arm)   Pulse 60   Temp 98.7 F (37.1 C) (Oral)   Resp 18   Ht 6\' 3"  (1.905 m)   Wt 68.1 kg (150 lb 3 oz)   SpO2 100%   BMI 18.77 kg/m   Physical Exam  Constitutional: He is oriented to person, place, and time. He appears well-developed and well-nourished. No distress.  HENT:  Head: Normocephalic and atraumatic.  Moist mucous membranes  Eyes: Conjunctivae are normal. Pupils are equal, round, and reactive to light.  Neck: Neck supple.    Cardiovascular: Normal rate, regular rhythm and normal heart sounds.  No murmur heard. Pulmonary/Chest: Effort normal and breath sounds normal.  Abdominal: Soft. Bowel sounds are normal. He exhibits no distension. There is tenderness in the epigastric area. There is no rebound and no guarding.  Musculoskeletal: He exhibits no edema.  Neurological: He is alert and oriented to person, place, and time.  Fluent speech  Skin: Skin is warm and dry.  Psychiatric: He has a normal mood and affect. Judgment normal.  Nursing note and vitals reviewed.    ED Treatments / Results  Labs (all labs ordered are listed, but only abnormal results are displayed) Labs Reviewed  LIPASE, BLOOD - Abnormal; Notable for the following components:      Result Value   Lipase 57 (*)    All other components within normal limits  COMPREHENSIVE METABOLIC PANEL - Abnormal; Notable for the following components:   Glucose, Bld 105 (*)    ALT 14 (*)    All other components within normal limits  URINALYSIS, ROUTINE W REFLEX MICROSCOPIC - Abnormal; Notable for the following components:   Ketones, ur 20 (*)    All other components within normal limits  CBC    EKG  EKG Interpretation None       Radiology No results found.  Procedures Procedures (including critical care time)  Medications Ordered in ED Medications  ondansetron (ZOFRAN) injection 4 mg (4 mg Intravenous Given 11/24/17 1909)  famotidine (PEPCID) tablet 40 mg (40 mg Oral Given 11/24/17 1906)  gi cocktail (Maalox,Lidocaine,Donnatal) (30 mLs Oral Given 11/24/17 1910)  dicyclomine (BENTYL) capsule 10 mg (10 mg Oral Given 11/24/17 1906)     Initial Impression / Assessment and Plan / ED Course  I have reviewed the triage vital signs and the nursing notes.  Pertinent labs that were available during my care of the patient were reviewed by me and considered in my medical decision making (see chart for details).     He states sx are c/w previous  episodes. Epigastric tenderness on exam without lower abd tenderness. Labs show lipase 57, normal creatinine, normal LFTs, normal CBC. Tolerating PO here after zofran, pepcid, GI cocktail, and bentyl. I have counseled the patient regarding cessation of marijuana use and talked about possibility of PUD or gastritis and need to stay on PPI, not just spot dosing when he has pain. Have encouraged to contact GI for f/u appt. patient voiced understanding of treatment plan and discharged in satisfactory condition. Final Clinical Impressions(s) / ED Diagnoses   Final diagnoses:  Nausea vomiting and diarrhea  Epigastric pain    ED Discharge Orders        Ordered    omeprazole (PRILOSEC) 40 MG capsule  Daily     11/24/17 2027    dicyclomine (BENTYL) 20 MG  tablet  3 times daily PRN     11/24/17 2027       Little, Ambrose Finlandachel Morgan, MD 11/24/17 2049

## 2017-11-25 ENCOUNTER — Encounter: Payer: Self-pay | Admitting: Gastroenterology

## 2018-01-04 ENCOUNTER — Other Ambulatory Visit: Payer: Self-pay | Admitting: Gastroenterology

## 2018-01-04 ENCOUNTER — Ambulatory Visit: Payer: Self-pay | Admitting: Gastroenterology

## 2018-01-04 ENCOUNTER — Other Ambulatory Visit: Payer: Self-pay

## 2018-02-12 ENCOUNTER — Encounter (HOSPITAL_COMMUNITY): Payer: Self-pay

## 2018-02-12 ENCOUNTER — Emergency Department (HOSPITAL_COMMUNITY)
Admission: EM | Admit: 2018-02-12 | Discharge: 2018-02-12 | Disposition: A | Discharge status: Home / Self Care | Payer: Self-pay | Attending: Emergency Medicine | Admitting: Emergency Medicine

## 2018-02-12 ENCOUNTER — Emergency Department (HOSPITAL_COMMUNITY): Payer: Self-pay

## 2018-02-12 ENCOUNTER — Other Ambulatory Visit: Payer: Self-pay

## 2018-02-12 DIAGNOSIS — Z79899 Other long term (current) drug therapy: Secondary | ICD-10-CM | POA: Insufficient documentation

## 2018-02-12 DIAGNOSIS — F17210 Nicotine dependence, cigarettes, uncomplicated: Secondary | ICD-10-CM | POA: Insufficient documentation

## 2018-02-12 DIAGNOSIS — R1013 Epigastric pain: Secondary | ICD-10-CM

## 2018-02-12 DIAGNOSIS — R1011 Right upper quadrant pain: Secondary | ICD-10-CM

## 2018-02-12 LAB — COMPREHENSIVE METABOLIC PANEL
ALK PHOS: 59 U/L (ref 38–126)
ALT: 14 U/L — ABNORMAL LOW (ref 17–63)
AST: 16 U/L (ref 15–41)
Albumin: 4.3 g/dL (ref 3.5–5.0)
Anion gap: 9 (ref 5–15)
BUN: 12 mg/dL (ref 6–20)
CO2: 28 mmol/L (ref 22–32)
Calcium: 9.3 mg/dL (ref 8.9–10.3)
Chloride: 96 mmol/L — ABNORMAL LOW (ref 101–111)
Creatinine, Ser: 0.95 mg/dL (ref 0.61–1.24)
GFR calc Af Amer: >60 mL/min (ref >60)
GFR calc non Af Amer: >60 mL/min (ref >60)
GLUCOSE: 103 mg/dL — AB (ref 65–99)
POTASSIUM: 3.6 mmol/L (ref 3.5–5.1)
SODIUM: 133 mmol/L — AB (ref 135–145)
Total Bilirubin: 1.4 mg/dL — ABNORMAL HIGH (ref 0.3–1.2)
Total Protein: 7.6 g/dL (ref 6.5–8.1)

## 2018-02-12 LAB — RAPID URINE DRUG SCREEN, HOSP PERFORMED
AMPHETAMINES: NOT DETECTED
BENZODIAZEPINES: NOT DETECTED
Barbiturates: NOT DETECTED
COCAINE: NOT DETECTED
Opiates: NOT DETECTED
TETRAHYDROCANNABINOL: POSITIVE — AB

## 2018-02-12 LAB — URINALYSIS, ROUTINE W REFLEX MICROSCOPIC
BACTERIA UA: NONE SEEN
Glucose, UA: NEGATIVE mg/dL
Hgb urine dipstick: NEGATIVE
Ketones, ur: 20 mg/dL — AB
Leukocytes, UA: NEGATIVE
NITRITE: NEGATIVE
PROTEIN: 30 mg/dL — AB
SPECIFIC GRAVITY, URINE: 1.036 — AB (ref 1.005–1.030)
SQUAMOUS EPITHELIAL / LPF: NONE SEEN
pH: 5 (ref 5.0–8.0)

## 2018-02-12 LAB — LIPASE, BLOOD: LIPASE: 59 U/L — AB (ref 11–51)

## 2018-02-12 LAB — CBC
HEMATOCRIT: 47.2 % (ref 39.0–52.0)
Hemoglobin: 16.3 g/dL (ref 13.0–17.0)
MCH: 31.9 pg (ref 26.0–34.0)
MCHC: 34.5 g/dL (ref 30.0–36.0)
MCV: 92.4 fL (ref 78.0–100.0)
Platelets: 284 10*3/uL (ref 150–400)
RBC: 5.11 MIL/uL (ref 4.22–5.81)
RDW: 12.3 % (ref 11.5–15.5)
WBC: 5.5 10*3/uL (ref 4.0–10.5)

## 2018-02-12 MED ORDER — OMEPRAZOLE 40 MG PO CPDR: 40.0000 mg | DELAYED_RELEASE_CAPSULE | Freq: Every day | ORAL | 1 refills | Status: DC

## 2018-02-12 MED ORDER — PROMETHAZINE HCL 25 MG PO TABS: 25.0000 mg | ORAL_TABLET | Freq: Four times a day (QID) | ORAL | 0 refills | Status: DC | PRN

## 2018-02-12 MED ORDER — PANTOPRAZOLE SODIUM 40 MG IV SOLR
40.0000 mg | Freq: Once | INTRAVENOUS | Status: AC
  Administered 2018-02-12: 40 mg via INTRAVENOUS
  Filled 2018-02-12: qty 40

## 2018-02-12 MED ORDER — MORPHINE SULFATE (PF) 4 MG/ML IV SOLN
4.0000 mg | Freq: Once | INTRAVENOUS | Status: AC
  Administered 2018-02-12: 4 mg via INTRAVENOUS
  Filled 2018-02-12: qty 1

## 2018-02-12 MED ORDER — ONDANSETRON HCL 4 MG/2ML IJ SOLN
4.0000 mg | Freq: Once | INTRAMUSCULAR | Status: AC
  Administered 2018-02-12: 4 mg via INTRAVENOUS
  Filled 2018-02-12: qty 2

## 2018-02-12 MED ORDER — SODIUM CHLORIDE 0.9 % IV BOLUS
1000.0000 mL | Freq: Once | INTRAVENOUS | Status: AC
  Administered 2018-02-12: 1000 mL via INTRAVENOUS

## 2018-02-12 NOTE — ED Triage Notes (Signed)
Pt c/o R sided abdominal pain that radiates up to his R shoulder. He is requesting an ultrasound for his gallbladder. He reports that he has been dx'd with pancreatitis, gastritis and cannabis hyperemesis syndrome, but is still having pain and nausea despite using the prescribed medications. He states that he hasn't smoked marijuana in over a month. He reports that he vomited x3 today. A&Ox4.

## 2018-02-12 NOTE — ED Provider Notes (Signed)
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  States that he has been in [place] for the past month as he is a [employer] architect[employer]ruck driver.  States he has had previous endoscopy, colonoscopy that did not show significant acute abnormality.  He denies diarrhea.  No bloody stool or melena.  No dysuria or hematuria.  No abdominal surgeries.  Denies aggravating or relieving factors.  Unable to describe the pain.  States he has been told this could be his gallbladder and is requesting ultrasound today.  ROS: See HPI Constitutional: no fever  Eyes: no drainage  ENT: no runny nose   Cardiovascular:  no chest pain  Resp: no SOB  GI:  vomiting GU: no dysuria Integumentary: no rash  Allergy: no hives  Musculoskeletal: no leg swelling  Neurological: no slurred speech ROS otherwise negative  PAST MEDICAL HISTORY/PAST SURGICAL HISTORY:  Past Medical History:  Diagnosis Date  . Gastritis   . Pancreatitis     MEDICATIONS:  Prior to Admission medications   Medication Sig Start Date End Date Taking? Authorizing Provider  acetaminophen (TYLENOL) 500 MG tablet Take 1,000 mg by mouth every 6 (six) hours as needed for mild pain, moderate pain or headache.   Yes [provider]  dicyclomine (BENTYL) 20 MG tablet Take 1 tablet (20 mg total) by mouth 3 (three) times daily as needed for spasms. 11/24/17  Yes Little, Ambrose Finlandachel Morgan, MD  ondansetron (ZOFRAN ODT) 4 MG disintegrating tablet Take 1 tablet (4 mg total) by mouth every 8 (eight) hours as needed for nausea or vomiting. 09/12/15  Yes Hedges, Tinnie GensJeffrey, PA-C  Capsaicin-Menthol-Methyl Sal (CAPSAICIN-METHYL SAL-MENTHOL) 0.025-1-12 % CREA Apply 1 g topically as needed (nausea and vomiting). Patient not taking: Reported on 02/12/2018 07/15/17    Michela PitcherFawze, Mina A, PA-C  omeprazole (PRILOSEC) 40 MG capsule Take 1 capsule (40 mg total) by mouth daily. Patient not taking: Reported on 02/12/2018 11/24/17   Little, Ambrose Finlandachel Morgan, MD  ondansetron (ZOFRAN) 4 MG tablet Take 1 tablet (4 mg total) by mouth every 8 (eight) hours as needed for nausea or vomiting. Patient not taking: Reported on 02/12/2018 07/15/17   Michela PitcherFawze, Mina A, PA-C  sucralfate (CARAFATE) 1 g tablet Take 1 tablet (1 g total) by mouth 4 (four) times daily -  with meals and at bedtime. Patient not taking: Reported on 02/12/2018 07/15/17   Michela PitcherFawze, Mina A, PA-C    ALLERGIES:  No Known Allergies  SOCIAL HISTORY:  Social History   Tobacco Use  . Smoking status: Current Every Day Smoker    Packs/day: 0.50    Types: Cigarettes  . Smokeless tobacco: Never Used  Substance Use Topics  . Alcohol use: No    FAMILY HISTORY: History reviewed. No pertinent family history.  EXAM: BP 136/84 (BP Location: Left Arm)   Pulse 74   Temp 98.2 F (36.8 C) (Oral)   Resp 16   SpO2 99%  CONSTITUTIONAL: Alert and oriented and responds appropriately to questions. Well-appearing; well-nourished HEAD: Normocephalic EYES: Conjunctivae clear, pupils appear equal, EOMI ENT: normal nose; moist mucous membranes NECK: Supple, no meningismus, no nuchal rigidity, no LAD  CARD: RRR; S1 and S2 appreciated; no murmurs, no clicks, no rubs, no gallops RESP: Normal chest excursion without splinting or tachypnea; breath sounds clear and equal bilaterally; no wheezes, no rhonchi,  no rales, no hypoxia or respiratory distress, speaking full sentences ABD/GI: Normal bowel sounds; non-distended; soft, tender in the right upper quadrant and epigastric region, negative Murphy sign, no tenderness at McBurney's point, no rebound, no guarding, no peritoneal signs, no hepatosplenomegaly BACK:  The back appears normal and is non-tender to palpation, there is no CVA tenderness EXT: Normal ROM in all joints; non-tender to palpation;  no edema; normal capillary refill; no cyanosis, no calf tenderness or swelling    SKIN: Normal color for age and race; warm; no rash NEURO: Moves all extremities equally PSYCH: The patient's mood and manner are appropriate. Grooming and personal hygiene are appropriate.  MEDICAL DECISION MAKING: Patient here with epigastric and right upper quadrant pain.  Suspect gastritis, GERD but could be cholelithiasis, cholecystitis.  Pancreatitis also in the differential but patient is very well-appearing here without vomiting.  Doubt appendicitis.  Labs pending.  Will obtain right upper quadrant ultrasound.  Will give IV fluids, pain and nausea medicine.  ED PROGRESS: Labs show mild elevation of his lipase in the 50s which is chronic.  LFTs otherwise unremarkable.  Right upper quadrant ultrasound unremarkable as well.  Suspect this is gastritis, GERD.  Recommended bland diet for the next several days will discharge with prescriptions of Phenergan and omeprazole.  Given outpatient PCP and GI follow-up.  Discussed return precautions.  He is comfortable with this plan.  I do not feel he needs emergent CT of his abdomen pelvis.  His drug screen is positive for marijuana.  We have discussed that this could also cause his symptoms and have advised him to stop smoking.  At this time, I do not feel there is any life-threatening condition present. I have reviewed and discussed all results (EKG, imaging, lab, urine as appropriate) and exam findings with patient/family. I have reviewed nursing notes and appropriate previous records.  I feel the patient is safe to be discharged home without further emergent workup and can continue workup as an outpatient as needed. Discussed usual and customary return precautions. Patient/family verbalize understanding and are comfortable with this plan.  Outpatient follow-up has been provided if needed. All questions have been answered.      Seara Hinesley, Layla Maw, DO 02/12/18 [phone]

## 2018-02-12 NOTE — Discharge Instructions (Signed)
Please avoid NSAIDs such as aspirin (Goody powders), ibuprofen (Motrin, Advil), naproxen (Aleve) as these may worsen your symptoms.  Tylenol 1000 mg every 6 hours is safe to take as long as you have no history of liver problems (heavy alcohol use, cirrhosis, hepatitis).  Please avoid spicy, acidic (citrus fruits, tomato based sauces, salsa), greasy, fatty foods.  Please avoid caffeine and alcohol.   ° ° ° °To find a primary care or specialty doctor please call [phone] or [phone] to access "[facility] Find a Doctor Service." ° °You may also go on the [facility] website at [email or web address] ° °There are also multiple [facility], [facility] practices throughout the [place] that are frequently accepting new patients. You may find a clinic that is close to your home and contact them. ° °[facility] -  °[address] °[place] [address] °[phone] ° ° °[facility] -  °[address] ° [place] [address] °[phone] ° ° °[facility] - °[address]  °[place] [address] °[phone] ° ° ° °

## 2018-08-12 ENCOUNTER — Emergency Department (HOSPITAL_BASED_OUTPATIENT_CLINIC_OR_DEPARTMENT_OTHER)
Admission: EM | Admit: 2018-08-12 | Discharge: 2018-08-12 | Disposition: A | Discharge status: Home / Self Care | Payer: Self-pay | Attending: Emergency Medicine | Admitting: Emergency Medicine

## 2018-08-12 ENCOUNTER — Encounter (HOSPITAL_BASED_OUTPATIENT_CLINIC_OR_DEPARTMENT_OTHER): Payer: Self-pay | Admitting: *Deleted

## 2018-08-12 ENCOUNTER — Other Ambulatory Visit: Payer: Self-pay

## 2018-08-12 DIAGNOSIS — Z79899 Other long term (current) drug therapy: Secondary | ICD-10-CM | POA: Insufficient documentation

## 2018-08-12 DIAGNOSIS — R112 Nausea with vomiting, unspecified: Secondary | ICD-10-CM | POA: Insufficient documentation

## 2018-08-12 DIAGNOSIS — F17210 Nicotine dependence, cigarettes, uncomplicated: Secondary | ICD-10-CM | POA: Insufficient documentation

## 2018-08-12 DIAGNOSIS — R197 Diarrhea, unspecified: Secondary | ICD-10-CM | POA: Insufficient documentation

## 2018-08-12 DIAGNOSIS — R638 Other symptoms and signs concerning food and fluid intake: Secondary | ICD-10-CM | POA: Insufficient documentation

## 2018-08-12 DIAGNOSIS — R1013 Epigastric pain: Secondary | ICD-10-CM | POA: Insufficient documentation

## 2018-08-12 DIAGNOSIS — F1210 Cannabis abuse, uncomplicated: Secondary | ICD-10-CM | POA: Insufficient documentation

## 2018-08-12 LAB — COMPREHENSIVE METABOLIC PANEL
ALK PHOS: 51 U/L (ref 38–126)
ALT: 16 U/L (ref 0–44)
ANION GAP: 10 (ref 5–15)
AST: 20 U/L (ref 15–41)
Albumin: 4.7 g/dL (ref 3.5–5.0)
BILIRUBIN TOTAL: 1.2 mg/dL (ref 0.3–1.2)
BUN: 15 mg/dL (ref 6–20)
CALCIUM: 9.7 mg/dL (ref 8.9–10.3)
CO2: 29 mmol/L (ref 22–32)
Chloride: 96 mmol/L — ABNORMAL LOW (ref 98–111)
Creatinine, Ser: 1.11 mg/dL (ref 0.61–1.24)
Glucose, Bld: 101 mg/dL — ABNORMAL HIGH (ref 70–99)
Potassium: 4.2 mmol/L (ref 3.5–5.1)
Sodium: 135 mmol/L (ref 135–145)
TOTAL PROTEIN: 8.1 g/dL (ref 6.5–8.1)

## 2018-08-12 LAB — URINALYSIS, ROUTINE W REFLEX MICROSCOPIC
Glucose, UA: NEGATIVE mg/dL
Hgb urine dipstick: NEGATIVE
Ketones, ur: 15 mg/dL — AB
Leukocytes, UA: NEGATIVE
NITRITE: NEGATIVE
Protein, ur: 30 mg/dL — AB
pH: 6 (ref 5.0–8.0)

## 2018-08-12 LAB — CBC
HCT: 47 % (ref 39.0–52.0)
HEMOGLOBIN: 16.6 g/dL (ref 13.0–17.0)
MCH: 32.4 pg (ref 26.0–34.0)
MCHC: 35.3 g/dL (ref 30.0–36.0)
MCV: 91.8 fL (ref 78.0–100.0)
Platelets: 212 10*3/uL (ref 150–400)
RBC: 5.12 MIL/uL (ref 4.22–5.81)
RDW: 12.1 % (ref 11.5–15.5)
WBC: 6.8 10*3/uL (ref 4.0–10.5)

## 2018-08-12 LAB — URINALYSIS, MICROSCOPIC (REFLEX)

## 2018-08-12 LAB — LIPASE, BLOOD: Lipase: 48 U/L (ref 11–51)

## 2018-08-12 MED ORDER — SODIUM CHLORIDE 0.9 % IV BOLUS
1000.0000 mL | Freq: Once | INTRAVENOUS | Status: AC
  Administered 2018-08-12: 1000 mL via INTRAVENOUS

## 2018-08-12 MED ORDER — ONDANSETRON HCL 4 MG PO TABS: 4.0000 mg | ORAL_TABLET | Freq: Four times a day (QID) | ORAL | 0 refills | Status: DC

## 2018-08-12 MED ORDER — DICYCLOMINE HCL 20 MG PO TABS: 20.0000 mg | ORAL_TABLET | Freq: Two times a day (BID) | ORAL | 0 refills | Status: DC

## 2018-08-12 MED ORDER — ONDANSETRON HCL 4 MG/2ML IJ SOLN
4.0000 mg | Freq: Once | INTRAMUSCULAR | Status: AC
  Administered 2018-08-12: 4 mg via INTRAVENOUS
  Filled 2018-08-12: qty 2

## 2018-08-12 MED FILL — ONDANSETRON HCL 4 MG TABLET: 4 | 2 days supply | Qty: 6 | Fill #0

## 2018-08-12 MED FILL — DICYCLOMINE 20 MG TABLET: 20 | 7 days supply | Qty: 14 | Fill #0

## 2018-08-12 NOTE — ED Provider Notes (Signed)
[facility] EMERGENCY DEPARTMENT Provider Note   CSN: [id number] Arrival date & time: 08/12/18  1134     History   Chief Complaint Chief Complaint  Patient presents with  . Abdominal Pain    HPI Brian Tate is a 27 y.o. male past medical history of gastritis, pancreatitis who presents for evaluation of 1 week of intermittent abdominal pain.  He states that pain will come and go but does not know specific action that triggers the pain.  He states it is not worse with eating though he does note he has had decreased appetite.  He reports several episodes of nonbloody, nonbilious vomiting.  He states he has not been able to tolerate much p.o. since onset of symptoms.  Additionally, he has had multiple episodes of nonbloody diarrhea.  He denies any recent antibiotic use or travel outside the country.  Patient states that he has had similar symptoms previously and has been evaluated several times by both emergency department and GI.  He states that he has had ultrasounds, CT scans and endoscopies, colonoscopies with no findings of what is causing his pain.  Patient states that he does not drink any alcohol.  He does report he smokes cigarettes.  He states he does not use any NSAIDs.  Patient denies any fevers, urinary complaints, chest pain, difficulty breathing.  Patient reports he has previously been seen by GI Dr. Natale Milch.   The history is provided by the patient.    Past Medical History:  Diagnosis Date  . Gastritis   . Pancreatitis     There are no active problems to display for this patient.   History reviewed. No pertinent surgical history.      Home Medications    Prior to Admission medications   Medication Sig Start Date End Date Taking? Authorizing Provider  acetaminophen (TYLENOL) 500 MG tablet Take 1,000 mg by mouth every 6 (six) hours as needed for mild pain, moderate pain or headache.    [provider]  dicyclomine (BENTYL) 20 MG tablet Take  1 tablet (20 mg total) by mouth 2 (two) times daily. 08/12/18   Maxwell Caul, PA-C  omeprazole (PRILOSEC) 40 MG capsule Take 1 capsule (40 mg total) by mouth daily. 02/12/18   Ward, Layla Maw, DO  ondansetron (ZOFRAN ODT) 4 MG disintegrating tablet Take 1 tablet (4 mg total) by mouth every 8 (eight) hours as needed for nausea or vomiting. 09/12/15   Hedges, Tinnie Gens, PA-C  ondansetron (ZOFRAN) 4 MG tablet Take 1 tablet (4 mg total) by mouth every 6 (six) hours. 08/12/18   Maxwell Caul, PA-C  promethazine (PHENERGAN) 25 MG tablet Take 1 tablet (25 mg total) by mouth every 6 (six) hours as needed for nausea or vomiting. 02/12/18   Ward, Layla Maw, DO    Family History No family history on file.  Social History Social History   Tobacco Use  . Smoking status: Current Every Day Smoker    Packs/day: 0.50    Types: Cigarettes  . Smokeless tobacco: Never Used  Substance Use Topics  . Alcohol use: No  . Drug use: Yes    Types: Marijuana    Comment: daily     Allergies   Patient has no known allergies.   Review of Systems Review of Systems  Constitutional: Positive for appetite change. Negative for fever.  Respiratory: Negative for cough and shortness of breath.   Cardiovascular: Negative for chest pain.  Gastrointestinal: Positive for abdominal pain, diarrhea, nausea  and vomiting. Negative for blood in stool.  Genitourinary: Negative for dysuria and hematuria.  Neurological: Negative for headaches.  All other systems reviewed and are negative.    Physical Exam Updated Vital Signs BP 115/70   Pulse 61   Temp 98.3 F (36.8 C) (Oral)   Resp 18   Ht 6\' 3"  (1.905 m)   Wt 68 kg   SpO2 96%   BMI 18.75 kg/m   Physical Exam  Constitutional: He is oriented to person, place, and time. He appears well-developed and well-nourished.  HENT:  Head: Normocephalic and atraumatic.  Mouth/Throat: Oropharynx is clear and moist and mucous membranes are normal.  Eyes: Pupils are equal,  round, and reactive to light. Conjunctivae, EOM and lids are normal.  Neck: Full passive range of motion without pain.  Cardiovascular: Normal rate, regular rhythm, normal heart sounds and normal pulses. Exam reveals no gallop and no friction rub.  No murmur heard. Pulmonary/Chest: Effort normal and breath sounds normal.  Lungs clear to auscultation bilaterally.  Symmetric chest rise.  No wheezing, rales, rhonchi.  Abdominal: Soft. Normal appearance. There is tenderness in the right upper quadrant and epigastric area. There is no rigidity, no guarding, no CVA tenderness and negative Murphy's sign.  Abdomen is soft, nondistended.  Tenderness palpation of the epigastric and right upper quadrant.  No rigidity, guarding.  No CVA tenderness bilaterally.  Musculoskeletal: Normal range of motion.  Neurological: He is alert and oriented to person, place, and time.  Skin: Skin is warm and dry. Capillary refill takes less than 2 seconds.  Psychiatric: He has a normal mood and affect. His speech is normal.  Nursing note and vitals reviewed.    ED Treatments / Results  Labs (all labs ordered are listed, but only abnormal results are displayed) Labs Reviewed  COMPREHENSIVE METABOLIC PANEL - Abnormal; Notable for the following components:      Result Value   Chloride 96 (*)    Glucose, Bld 101 (*)    All other components within normal limits  URINALYSIS, ROUTINE W REFLEX MICROSCOPIC - Abnormal; Notable for the following components:   Specific Gravity, Urine >1.030 (*)    Bilirubin Urine SMALL (*)    Ketones, ur 15 (*)    Protein, ur 30 (*)    All other components within normal limits  URINALYSIS, MICROSCOPIC (REFLEX) - Abnormal; Notable for the following components:   Bacteria, UA MANY (*)    All other components within normal limits  LIPASE, BLOOD  CBC    EKG None  Radiology No results found.  Procedures Procedures (including critical care time)  Medications Ordered in  ED Medications  ondansetron (ZOFRAN) injection 4 mg (4 mg Intravenous Given 08/12/18 1235)  sodium chloride 0.9 % bolus 1,000 mL ( Intravenous Stopped 08/12/18 1346)     Initial Impression / Assessment and Plan / ED Course  I have reviewed the triage vital signs and the nursing notes.  Pertinent labs & imaging results that were available during my care of the patient were reviewed by me and considered in my medical decision making (see chart for details).     27 y.o. male possible history of gastritis who presents for evaluation of epigastric abdominal pain, nausea that is been ongoing for a week.  He states that he has had this similar pain for several months.  He states that it comes and goes.  He has had multiple work ups including GI follow-up.  He states that they have not found  a reason why he continues to have pain.  He reports no blood in vomiting.  No fevers. Patient is afebril, non-toxic appearing, sitting comfortably on examination table. Vital signs reviewed and stable.  On exam, abdomen is soft, nondistended.  Tenderness noted to the epigastrium right upper quadrant region.  No tenderness noted to McBurney's point.  No rigidity, guarding.  Consider infectious etiology versus peptic ulcer disease versus hepatobiliary etiology.  Will check basic labs.  Review of patient records that he has had multiple sets for similar symptoms.  He has had extensive work-up, including labs, ultrasound, other imaging.  Additionally, patient reports he has seen GI.  He had a right upper quadrant ultrasound in April 2018 that showed no evidence of abnormal.  No evidence of gallstones.   CBC without any significant leukocytosis or anemia.  Lipase unremarkable.  CMP shows chloride of 96.  Otherwise unremarkable.  No elevation in LFTs that would be concerning for hepatobiliary etiology.  UA shows no infectious etiology.  No indication for CT on pelvis at this time as I do not suspect surgical  abdomen.  Reevaluation.  Patient reports he does not currently have any pain.  Repeat abdominal exam is benign with no evidence of tenderness.  He is tolerating p.o. in the department any difficulty.  Vitals are stable.  I discussed his results with him.  I reviewed his records that he had a previous right upper quadrant ultrasound done in April 2018 that was negative for any acute abnormalities.  I offered to do it again here in the ED for evaluation.  We discussed at length regarding further work-up patient states he does not want to get it done today since last was normal.  He would rather follow-up with GI.  At this time, no indication for CT and pelvis has had mildly that there is any cause for surgical abdomen.  Will send patient home with Bentyl, Zofran for nausea.  Encourage at home supportive care measures.  Instructed to follow-up with his GI doctor as directed. Patient had ample opportunity for questions and discussion. All patient's questions were answered with full understanding. Strict return precautions discussed. Patient expresses understanding and agreement to plan.   Final Clinical Impressions(s) / ED Diagnoses   Final diagnoses:  Epigastric pain  Nausea vomiting and diarrhea    ED Discharge Orders         Ordered    dicyclomine (BENTYL) 20 MG tablet  2 times daily     08/12/18 1336    ondansetron (ZOFRAN) 4 MG tablet  Every 6 hours     08/12/18 1336           Franky, Reier 08/12/18 1555    Alvira Monday, MD 08/12/18 2215

## 2018-08-12 NOTE — ED Triage Notes (Signed)
Abdominal pain for a week. Vomiting.

## 2018-08-12 NOTE — Discharge Instructions (Signed)
Continue taking medications were previously prescribed.  Take Bentyl as directed for pain and Zofran for nausea.  As we discussed, follow-up with GI doctor for further evaluation of your symptoms.  Return the emergency department for any worsening pain, fevers, blood in her vomit, inability to eat or drink anything or any other worsening or concerning symptoms.

## 2019-02-22 ENCOUNTER — Emergency Department (HOSPITAL_BASED_OUTPATIENT_CLINIC_OR_DEPARTMENT_OTHER)
Admission: EM | Admit: 2019-02-22 | Discharge: 2019-02-23 | Disposition: A | Discharge status: Home / Self Care | Payer: Self-pay | Attending: Emergency Medicine | Admitting: Emergency Medicine

## 2019-02-22 ENCOUNTER — Encounter (HOSPITAL_BASED_OUTPATIENT_CLINIC_OR_DEPARTMENT_OTHER): Payer: Self-pay | Admitting: *Deleted

## 2019-02-22 ENCOUNTER — Other Ambulatory Visit: Payer: Self-pay

## 2019-02-22 DIAGNOSIS — K859 Acute pancreatitis without necrosis or infection, unspecified: Secondary | ICD-10-CM | POA: Insufficient documentation

## 2019-02-22 DIAGNOSIS — Z79899 Other long term (current) drug therapy: Secondary | ICD-10-CM | POA: Insufficient documentation

## 2019-02-22 DIAGNOSIS — R1013 Epigastric pain: Secondary | ICD-10-CM | POA: Insufficient documentation

## 2019-02-22 DIAGNOSIS — F17210 Nicotine dependence, cigarettes, uncomplicated: Secondary | ICD-10-CM | POA: Insufficient documentation

## 2019-02-22 DIAGNOSIS — R1115 Cyclical vomiting syndrome unrelated to migraine: Secondary | ICD-10-CM | POA: Insufficient documentation

## 2019-02-22 LAB — COMPREHENSIVE METABOLIC PANEL
ALT: 12 U/L (ref 0–44)
AST: 19 U/L (ref 15–41)
Albumin: 4.8 g/dL (ref 3.5–5.0)
Alkaline Phosphatase: 62 U/L (ref 38–126)
Anion gap: 9 (ref 5–15)
BUN: 13 mg/dL (ref 6–20)
CO2: 31 mmol/L (ref 22–32)
Calcium: 9.5 mg/dL (ref 8.9–10.3)
Chloride: 95 mmol/L — ABNORMAL LOW (ref 98–111)
Creatinine, Ser: 1.3 mg/dL — ABNORMAL HIGH (ref 0.61–1.24)
GFR calc Af Amer: >60 mL/min (ref >60)
GFR calc non Af Amer: >60 mL/min (ref >60)
Glucose, Bld: 112 mg/dL — ABNORMAL HIGH (ref 70–99)
Potassium: 3.5 mmol/L (ref 3.5–5.1)
Sodium: 135 mmol/L (ref 135–145)
Total Bilirubin: 0.7 mg/dL (ref 0.3–1.2)
Total Protein: 8.6 g/dL — ABNORMAL HIGH (ref 6.5–8.1)

## 2019-02-22 LAB — CBC
HCT: 46.1 % (ref 39.0–52.0)
Hemoglobin: 14.7 g/dL (ref 13.0–17.0)
MCH: 29.5 pg (ref 26.0–34.0)
MCHC: 31.9 g/dL (ref 30.0–36.0)
MCV: 92.6 fL (ref 80.0–100.0)
Platelets: 295 10*3/uL (ref 150–400)
RBC: 4.98 MIL/uL (ref 4.22–5.81)
RDW: 12.8 % (ref 11.5–15.5)
WBC: 7.6 10*3/uL (ref 4.0–10.5)
nRBC: 0 % (ref 0.0–0.2)

## 2019-02-22 LAB — URINALYSIS, ROUTINE W REFLEX MICROSCOPIC
Bilirubin Urine: NEGATIVE
Glucose, UA: NEGATIVE mg/dL
Hgb urine dipstick: NEGATIVE
Ketones, ur: NEGATIVE mg/dL
Leukocytes,Ua: NEGATIVE
Nitrite: NEGATIVE
Protein, ur: NEGATIVE mg/dL
Specific Gravity, Urine: 1.025 (ref 1.005–1.030)
pH: 6.5 (ref 5.0–8.0)

## 2019-02-22 LAB — LIPASE, BLOOD: Lipase: 61 U/L — ABNORMAL HIGH (ref 11–51)

## 2019-02-22 MED ORDER — METOCLOPRAMIDE HCL 5 MG/ML IJ SOLN
10.0000 mg | Freq: Once | INTRAMUSCULAR | Status: AC
  Administered 2019-02-22: 10 mg via INTRAVENOUS
  Filled 2019-02-22: qty 2

## 2019-02-22 MED ORDER — LACTATED RINGERS IV BOLUS
2000.0000 mL | Freq: Once | INTRAVENOUS | Status: AC
  Administered 2019-02-22: 1000 mL via INTRAVENOUS

## 2019-02-22 MED ORDER — DIPHENHYDRAMINE HCL 50 MG/ML IJ SOLN
25.0000 mg | Freq: Once | INTRAMUSCULAR | Status: AC
  Administered 2019-02-22: 25 mg via INTRAVENOUS
  Filled 2019-02-22: qty 1

## 2019-02-22 MED ORDER — ONDANSETRON HCL 4 MG/2ML IJ SOLN
4.0000 mg | Freq: Once | INTRAMUSCULAR | Status: AC
  Administered 2019-02-22: 4 mg via INTRAVENOUS
  Filled 2019-02-22: qty 2

## 2019-02-22 MED ORDER — PANTOPRAZOLE SODIUM 40 MG IV SOLR
40.0000 mg | Freq: Once | INTRAVENOUS | Status: AC
  Administered 2019-02-22: 40 mg via INTRAVENOUS
  Filled 2019-02-22: qty 40

## 2019-02-22 MED ORDER — FAMOTIDINE IN NACL 20-0.9 MG/50ML-% IV SOLN
20.0000 mg | Freq: Once | INTRAVENOUS | Status: AC
  Administered 2019-02-22: 20 mg via INTRAVENOUS
  Filled 2019-02-22: qty 50

## 2019-02-22 MED ORDER — CAPSAICIN 0.025 % EX CREA
TOPICAL_CREAM | Freq: Once | CUTANEOUS | Status: AC
  Administered 2019-02-22: 23:00:00 via TOPICAL
  Filled 2019-02-22 (×2): qty 60

## 2019-02-22 NOTE — ED Triage Notes (Signed)
Pt c/o diffuse abd pain with n/v x 3 days

## 2019-02-22 NOTE — ED Provider Notes (Signed)
[facility] EMERGENCY DEPARTMENT Provider Note   CSN: [id number] Arrival date & time: 02/22/19  2156    History   Chief Complaint Chief Complaint  Patient presents with  . Abdominal Pain    HPI Brian Tate is a 28 y.o. male.     HPI Patient reports he has had multiple similar episodes of this type of pain.  He reports that he will go sometimes for months without having any problems and then all of a sudden he gets severe epigastric pain and begins having recurrent episodes of vomiting.  He reports this is the same as previous episodes.  He has had dry heaves and vomiting overnight.  Burning epigastric pain.  No diarrhea, no fever.  No cough no shortness of breath no chest pain.  No urinary symptoms.  He reports in the past he has been diagnosed with gastritis or pancreatitis.  Denies alcohol use.  He reports he has had endoscopies and ultrasounds but never a specific diagnosis.  Reports he does smoke marijuana and has been at times told that could be due to marijuana.  He reports that once it seems to clear up he does not continue taking medications because they do not seem to make any difference and he may go months without having a rebound episode. Past Medical History:  Diagnosis Date  . Gastritis   . Pancreatitis     There are no active problems to display for this patient.   History reviewed. No pertinent surgical history.      Home Medications    Prior to Admission medications   Medication Sig Start Date End Date Taking? Authorizing Provider  acetaminophen (TYLENOL) 500 MG tablet Take 1,000 mg by mouth every 6 (six) hours as needed for mild pain, moderate pain or headache.    [provider]  dicyclomine (BENTYL) 20 MG tablet Take 1 tablet (20 mg total) by mouth 2 (two) times daily. 08/12/18   Maxwell CaulLayden, Evola A, PA-C  metoCLOPramide (REGLAN) 10 MG tablet Take 1 tablet (10 mg total) by mouth 4 (four) times daily -  before meals and at bedtime.  02/23/19   Arby BarrettePfeiffer, Salene Mohamud, MD  omeprazole (PRILOSEC) 20 MG capsule Take 1 capsule (20 mg total) by mouth daily. 02/23/19   Arby BarrettePfeiffer, Marah Park, MD  ondansetron (ZOFRAN ODT) 4 MG disintegrating tablet Take 1 tablet (4 mg total) by mouth every 8 (eight) hours as needed for nausea or vomiting. 09/12/15   Hedges, Tinnie GensJeffrey, PA-C  ondansetron (ZOFRAN ODT) 4 MG disintegrating tablet Take 1 tablet (4 mg total) by mouth every 4 (four) hours as needed for nausea or vomiting. 02/23/19   Arby BarrettePfeiffer, Emunah Texidor, MD  ondansetron (ZOFRAN) 4 MG tablet Take 1 tablet (4 mg total) by mouth every 6 (six) hours. 08/12/18   Maxwell CaulLayden, Sidman A, PA-C  promethazine (PHENERGAN) 25 MG tablet Take 1 tablet (25 mg total) by mouth every 6 (six) hours as needed for nausea or vomiting. 02/12/18   Ward, Layla MawKristen N, DO    Family History History reviewed. No pertinent family history.  Social History Social History   Tobacco Use  . Smoking status: Current Every Day Smoker    Packs/day: 0.50    Types: Cigarettes  . Smokeless tobacco: Never Used  Substance Use Topics  . Alcohol use: No  . Drug use: Yes    Types: Marijuana    Comment: daily     Allergies   Patient has no known allergies.   Review of Systems Review of Systems  10 Systems reviewed and are negative for acute change except as noted in the HPI.   Physical Exam Updated Vital Signs BP (!) 151/98   Pulse 81   Temp 98.5 F (36.9 C)   Resp 18   Ht  (1.905 m)   Wt 77.1 kg   SpO2 100%   BMI 21.25 kg/m   Physical Exam Constitutional:      Comments: Nontoxic.  No respiratory distress.  Patient appears uncomfortable.  Cooperative with clear mental status.  HENT:     Head: Normocephalic and atraumatic.  Eyes:     Extraocular Movements: Extraocular movements intact.     Conjunctiva/sclera: Conjunctivae normal.  Cardiovascular:     Rate and Rhythm: Normal rate and regular rhythm.  Pulmonary:     Effort: Pulmonary effort is normal.     Breath sounds: Normal  breath sounds.  Abdominal:     Comments: Epigastric pain to palpation.  No guarding.  Lower abdomen nontender.  Musculoskeletal: Normal range of motion.        General: No swelling or tenderness.  Skin:    General: Skin is warm and dry.  Neurological:     General: No focal deficit present.     Mental Status: He is oriented to person, place, and time.     Coordination: Coordination normal.  Psychiatric:        Mood and Affect: Mood normal.      ED Treatments / Results  Labs (all labs ordered are listed, but only abnormal results are displayed) Labs Reviewed  LIPASE, BLOOD - Abnormal; Notable for the following components:      Result Value   Lipase 61 (*)    All other components within normal limits  COMPREHENSIVE METABOLIC PANEL - Abnormal; Notable for the following components:   Chloride 95 (*)    Glucose, Bld 112 (*)    Creatinine, Ser 1.30 (*)    Total Protein 8.6 (*)    All other components within normal limits  URINALYSIS, ROUTINE W REFLEX MICROSCOPIC  CBC    EKG None  Radiology No results found.  Procedures Procedures (including critical care time)  Medications Ordered in ED Medications  ondansetron (ZOFRAN) injection 4 mg (4 mg Intravenous Given 02/22/19 2224)  lactated ringers bolus 2,000 mL (1,000 mLs Intravenous New Bag/Given 02/22/19 2254)  famotidine (PEPCID) IVPB 20 mg premix (0 mg Intravenous Stopped 02/22/19 2313)  metoCLOPramide (REGLAN) injection 10 mg (10 mg Intravenous Given 02/22/19 2250)  diphenhydrAMINE (BENADRYL) injection 25 mg (25 mg Intravenous Given 02/22/19 2250)  capsaicin (ZOSTRIX) 0.025 % cream ( Topical Given 02/22/19 2256)  ondansetron (ZOFRAN) injection 4 mg (4 mg Intravenous Given 02/22/19 2355)  pantoprazole (PROTONIX) injection 40 mg (40 mg Intravenous Given 02/22/19 2355)     Initial Impression / Assessment and Plan / ED Course  I have reviewed the triage vital signs and the nursing notes.  Pertinent labs & imaging results that  were available during my care of the patient were reviewed by me and considered in my medical decision making (see chart for details).       Recheck: Patient reports that he does feel better.  He still has some nausea and discomfort but symptoms are improving.  Patient does not have surgical abdomen.  Has had recurrence of similar symptoms frequently.  He does have mild elevation in lipase.  At this time will try outpatient management.  Patient has been seen by gastroenterology in the past.  Recommendations for follow-up with  gastroenterology as well as his family doctor.  Patient counseled on necessity to avoid all marijuana and alcohol.  Return precautions reviewed.  Final Clinical Impressions(s) / ED Diagnoses   Final diagnoses:  Epigastric pain  Cyclical vomiting  Acute pancreatitis, unspecified complication status, unspecified pancreatitis type    ED Discharge Orders         Ordered    ondansetron (ZOFRAN ODT) 4 MG disintegrating tablet  Every 4 hours PRN     02/23/19 0019    omeprazole (PRILOSEC) 20 MG capsule  Daily     02/23/19 0019    metoCLOPramide (REGLAN) 10 MG tablet  3 times daily before meals & bedtime     02/23/19 0019           Arby Barrette, MD 02/23/19 0021

## 2019-02-23 MED ORDER — ONDANSETRON 4 MG PO TBDP: 4.0000 mg | ORAL_TABLET | ORAL | 0 refills | Status: DC | PRN

## 2019-02-23 MED ORDER — OMEPRAZOLE 20 MG PO CPDR: 20.0000 mg | DELAYED_RELEASE_CAPSULE | Freq: Every day | ORAL | 1 refills | Status: DC

## 2019-02-23 MED ORDER — METOCLOPRAMIDE HCL 10 MG PO TABS: 10.0000 mg | ORAL_TABLET | Freq: Three times a day (TID) | ORAL | 0 refills | Status: DC

## 2019-02-23 NOTE — Discharge Instructions (Signed)
1.  Take Prilosec daily for the next month.  Take Reglan up to 4 times a day as needed for nausea and vomiting.  Take Zofran if needed for additional control of nausea or vomiting. 2.  Avoid any alcohol or marijuana.  These may significantly worsen your symptoms. 3.  Eat a very bland and low-fat diet for the next week. 4.  Schedule a follow-up with your family doctor and gastroenterologist as soon as possible. 5.  Return to the emergency department if symptoms are worsening or not improving.

## 2019-08-07 IMAGING — US US ABDOMEN LIMITED
1 series · 14 of 25 positions shown · non-contrast
Comparison: None.

CLINICAL DATA: 26 y/o  M; right upper quadrant abdominal pain.

EXAM:
ULTRASOUND ABDOMEN LIMITED RIGHT UPPER QUADRANT

[Series 1: us abdomen limited · 14 of 51 slices shown]
[im 1/51]
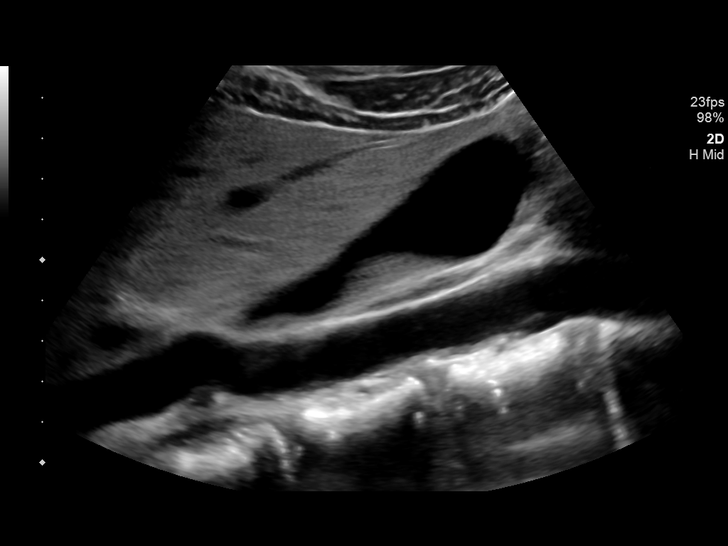
[im 5/51]
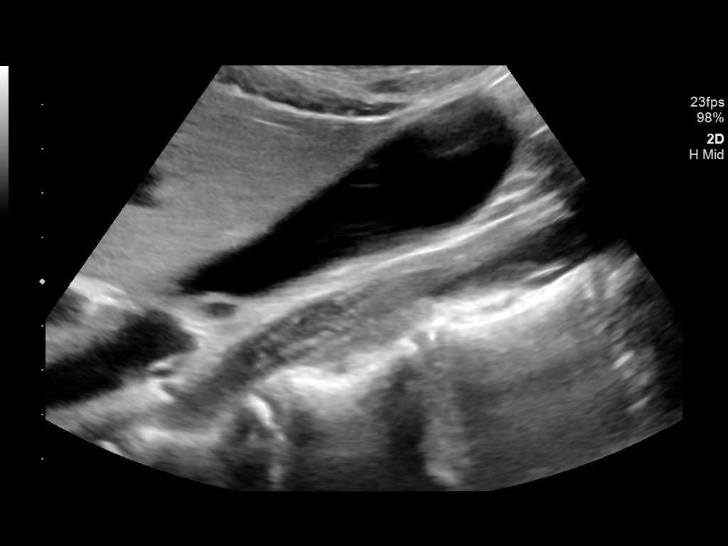
[im 9/51]
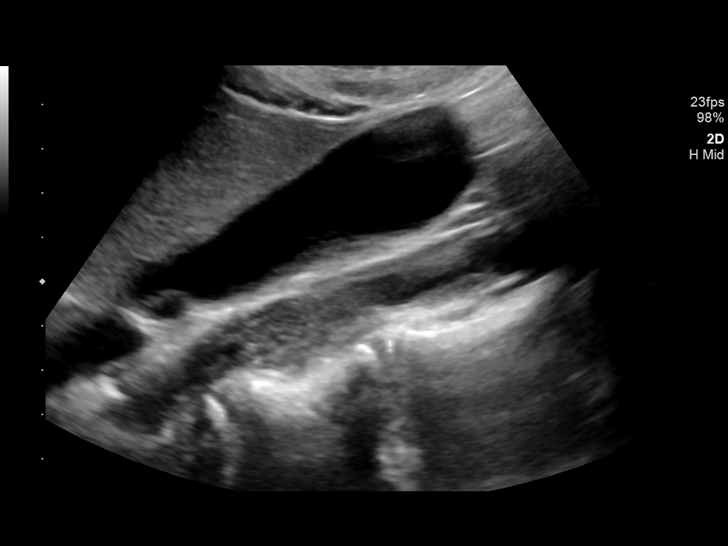
[im 13/51]
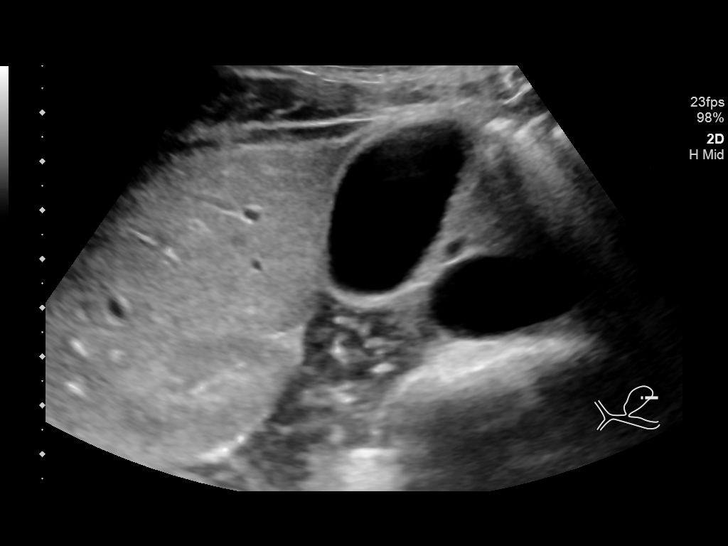
[im 17/51]
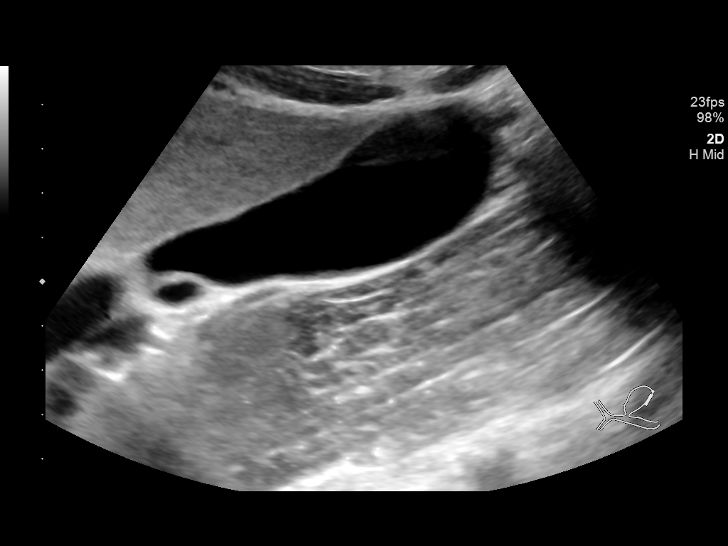
[im 19/51]
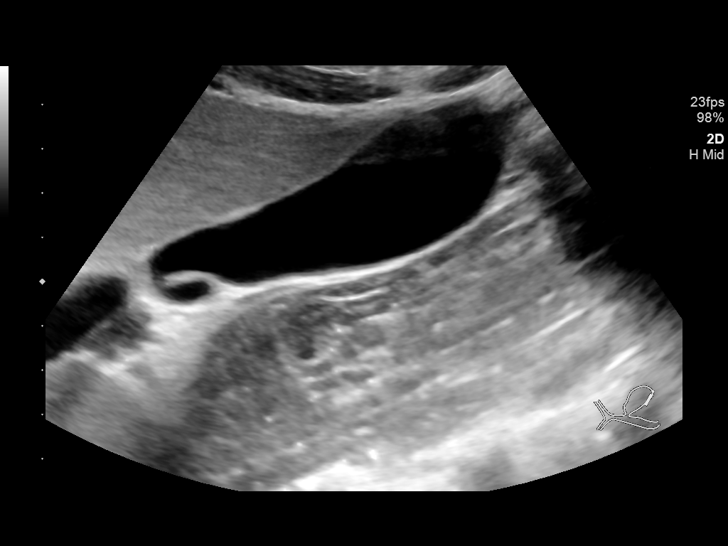
[im 23/51]
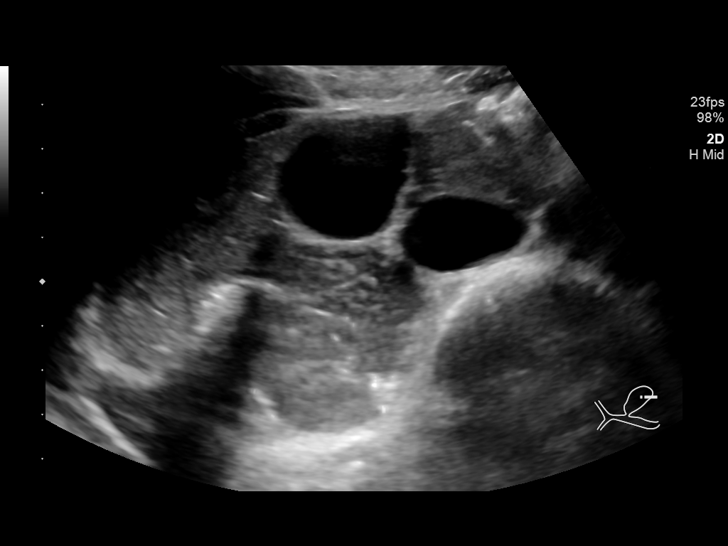
[im 28/51]
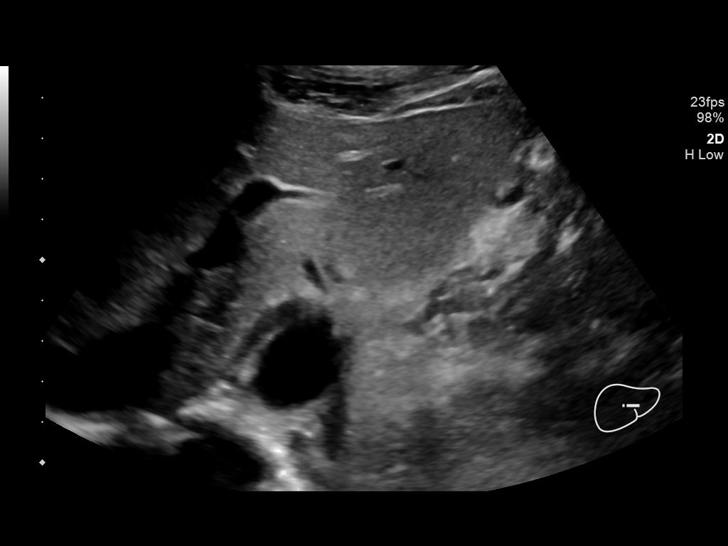
[im 32/51]
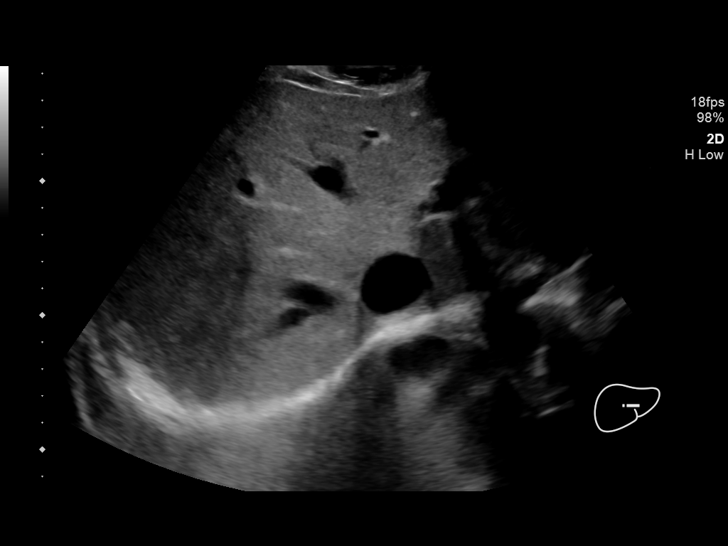
[im 34/51]
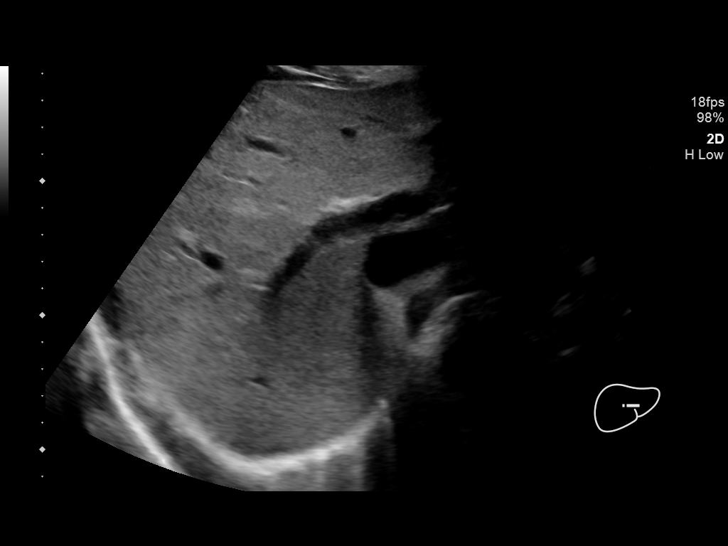
[im 38/51]
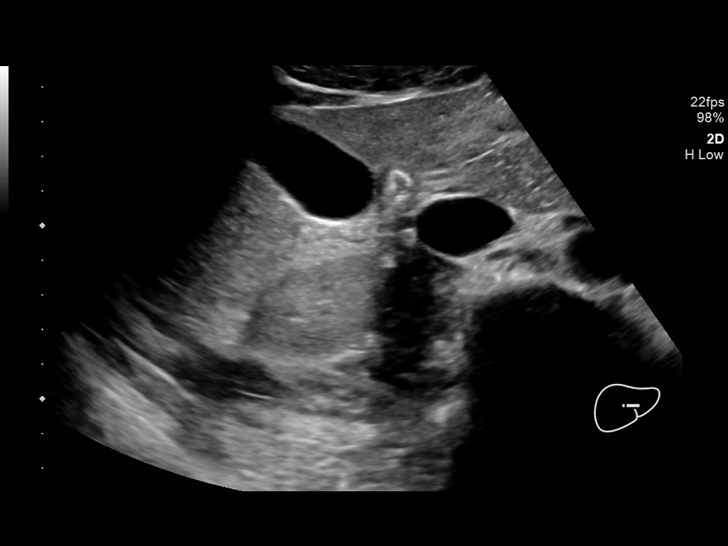
[im 42/51]
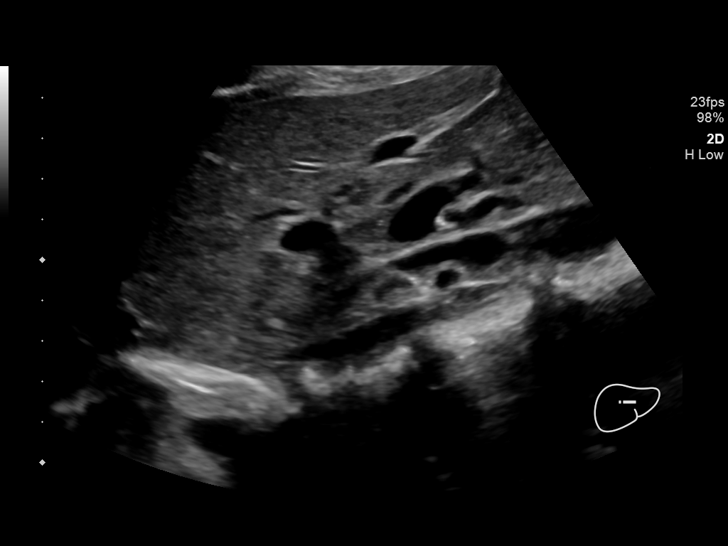
[im 46/51]
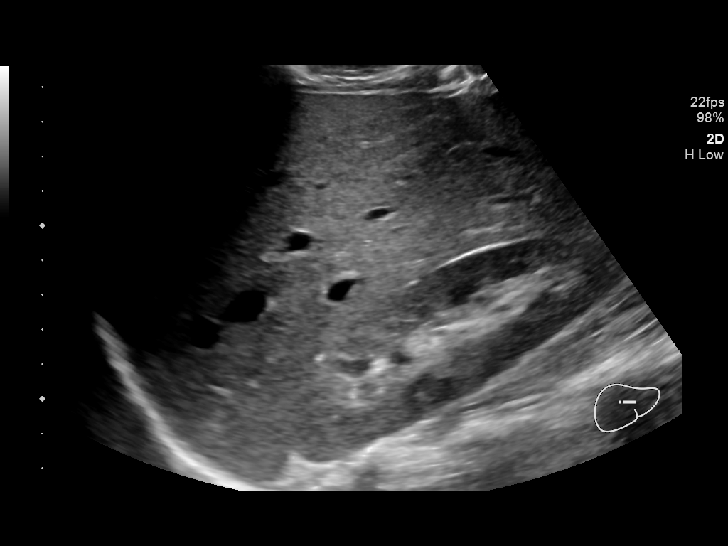
[im 51/51]
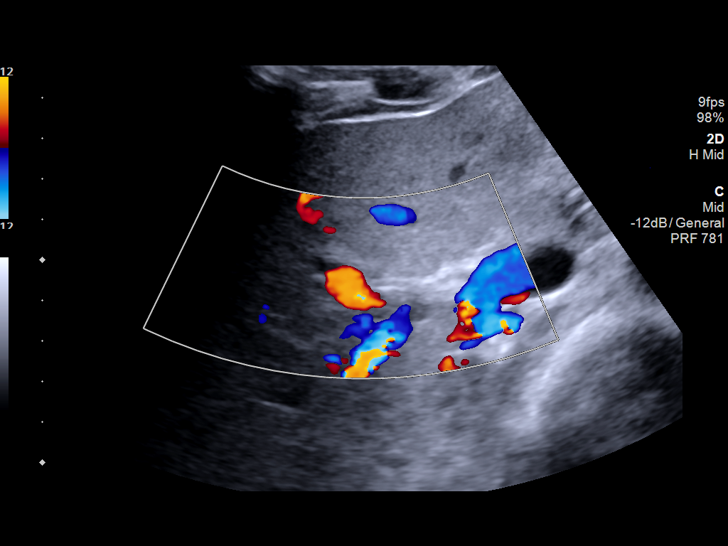

[14 of 25 positions shown; findings below may reference images not displayed]

FINDINGS: Gallbladder:

No gallstones or wall thickening visualized. No sonographic Murphy
sign noted by sonographer.

Common bile duct:

Diameter: 4.4 mm

Liver:

No focal lesion identified. Within normal limits in parenchymal
echogenicity. Portal vein is patent on color Doppler imaging with
normal direction of blood flow towards the liver.
IMPRESSION: Normal right upper quadrant ultrasound.

By: Ribal Hazema M.D.

## 2020-05-05 ENCOUNTER — Encounter (HOSPITAL_BASED_OUTPATIENT_CLINIC_OR_DEPARTMENT_OTHER): Payer: Self-pay | Admitting: Emergency Medicine

## 2020-05-05 ENCOUNTER — Emergency Department (HOSPITAL_BASED_OUTPATIENT_CLINIC_OR_DEPARTMENT_OTHER)
Admission: EM | Admit: 2020-05-05 | Discharge: 2020-05-05 | Discharge status: Against Medical Advice | Payer: Self-pay | Attending: Emergency Medicine | Admitting: Emergency Medicine

## 2020-05-05 ENCOUNTER — Emergency Department (HOSPITAL_BASED_OUTPATIENT_CLINIC_OR_DEPARTMENT_OTHER)
Admission: EM | Admit: 2020-05-05 | Discharge: 2020-05-05 | Disposition: A | Discharge status: Home / Self Care | Payer: Self-pay | Attending: Emergency Medicine | Admitting: Emergency Medicine

## 2020-05-05 ENCOUNTER — Other Ambulatory Visit: Payer: Self-pay

## 2020-05-05 DIAGNOSIS — F17210 Nicotine dependence, cigarettes, uncomplicated: Secondary | ICD-10-CM | POA: Insufficient documentation

## 2020-05-05 DIAGNOSIS — R1013 Epigastric pain: Secondary | ICD-10-CM | POA: Insufficient documentation

## 2020-05-05 DIAGNOSIS — R110 Nausea: Secondary | ICD-10-CM | POA: Insufficient documentation

## 2020-05-05 DIAGNOSIS — Z5321 Procedure and treatment not carried out due to patient leaving prior to being seen by health care provider: Secondary | ICD-10-CM | POA: Insufficient documentation

## 2020-05-05 MED ORDER — ONDANSETRON HCL 4 MG PO TABS: 4.0000 mg | ORAL_TABLET | Freq: Three times a day (TID) | ORAL | 0 refills | Status: DC | PRN

## 2020-05-05 MED ORDER — PANTOPRAZOLE SODIUM 20 MG PO TBEC: 20.0000 mg | DELAYED_RELEASE_TABLET | Freq: Every day | ORAL | 1 refills | Status: DC

## 2020-05-05 MED ORDER — ONDANSETRON 4 MG PO TBDP
4.0000 mg | ORAL_TABLET | Freq: Once | ORAL | Status: AC
  Administered 2020-05-05: 4 mg via ORAL
  Filled 2020-05-05: qty 1

## 2020-05-05 MED ORDER — SUCRALFATE 1 G PO TABS: 1.0000 g | ORAL_TABLET | Freq: Three times a day (TID) | ORAL | 0 refills | Status: DC

## 2020-05-05 MED ORDER — ALUM & MAG HYDROXIDE-SIMETH 200-200-20 MG/5ML PO SUSP
30.0000 mL | Freq: Once | ORAL | Status: AC
  Administered 2020-05-05: 30 mL via ORAL
  Filled 2020-05-05: qty 30

## 2020-05-05 MED ORDER — LIDOCAINE VISCOUS HCL 2 % MT SOLN
15.0000 mL | Freq: Once | OROMUCOSAL | Status: AC
  Administered 2020-05-05: 15 mL via ORAL
  Filled 2020-05-05: qty 15

## 2020-05-05 NOTE — ED Triage Notes (Signed)
Was here with early this morning. States he was discharged in 15 min and he wanted an IV but didn't get one. Hx of gastritis. C/o burning in epigastric area.

## 2020-05-05 NOTE — ED Triage Notes (Signed)
Pt here with hx of gastritis and has had burning in epigastric area x 1week with N/V.

## 2020-05-05 NOTE — ED Provider Notes (Signed)
[facility] EMERGENCY DEPARTMENT Provider Note   CSN: [id number] Arrival date & time: 05/05/20  0707     History Chief Complaint  Patient presents with   Abdominal Pain    Brian Tate is a 29 y.o. male.  The history is provided by the patient.  Abdominal Pain Pain location:  Epigastric Pain quality: burning and cramping   Pain radiates to:  Does not radiate Pain severity:  Mild Onset quality:  Gradual Timing:  Intermittent Progression:  Waxing and waning Chronicity:  New Context: not alcohol use and not previous surgeries   Relieved by:  Nothing Worsened by:  Eating Associated symptoms: nausea   Associated symptoms: no anorexia, no belching, no chest pain, no chills, no constipation, no cough, no diarrhea, no dysuria, no fever, no hematuria, no melena, no shortness of breath, no sore throat and no vomiting   Risk factors: no alcohol abuse and has not had multiple surgeries        Past Medical History:  Diagnosis Date   Gastritis    Pancreatitis     There are no problems to display for this patient.   History reviewed. No pertinent surgical history.     History reviewed. No pertinent family history.  Social History   Tobacco Use   Smoking status: Current Every Day Smoker    Packs/day: 0.50    Types: Cigarettes   Smokeless tobacco: Never Used  Vaping Use   Vaping Use: Never used  Substance Use Topics   Alcohol use: No   Drug use: Yes    Types: Marijuana    Comment: daily    Home Medications Prior to Admission medications   Medication Sig Start Date End Date Taking? Authorizing Provider  acetaminophen (TYLENOL) 500 MG tablet Take 1,000 mg by mouth every 6 (six) hours as needed for mild pain, moderate pain or headache.    [provider]  dicyclomine (BENTYL) 20 MG tablet Take 1 tablet (20 mg total) by mouth 2 (two) times daily. 08/12/18   Maxwell Caul, PA-C  metoCLOPramide (REGLAN) 10 MG tablet Take 1 tablet (10  mg total) by mouth 4 (four) times daily -  before meals and at bedtime. 02/23/19   Arby Barrette, MD  omeprazole (PRILOSEC) 20 MG capsule Take 1 capsule (20 mg total) by mouth daily. 02/23/19   Arby Barrette, MD  ondansetron (ZOFRAN ODT) 4 MG disintegrating tablet Take 1 tablet (4 mg total) by mouth every 8 (eight) hours as needed for nausea or vomiting. 09/12/15   Hedges, Tinnie Gens, PA-C  ondansetron (ZOFRAN ODT) 4 MG disintegrating tablet Take 1 tablet (4 mg total) by mouth every 4 (four) hours as needed for nausea or vomiting. 02/23/19   Arby Barrette, MD  ondansetron (ZOFRAN) 4 MG tablet Take 1 tablet (4 mg total) by mouth every 8 (eight) hours as needed for up to 20 doses for nausea or vomiting. 05/05/20   Tniyah Nakagawa, DO  pantoprazole (PROTONIX) 20 MG tablet Take 1 tablet (20 mg total) by mouth daily. 05/05/20 06/04/20  Terianne Thaker, DO  promethazine (PHENERGAN) 25 MG tablet Take 1 tablet (25 mg total) by mouth every 6 (six) hours as needed for nausea or vomiting. 02/12/18   Ward, Layla Maw, DO  sucralfate (CARAFATE) 1 g tablet Take 1 tablet (1 g total) by mouth 4 (four) times daily -  with meals and at bedtime for 14 days. 05/05/20 05/19/20  Virgina Norfolk, DO    Allergies    Patient has no  known allergies.  Review of Systems   Review of Systems  Constitutional: Negative for chills and fever.  HENT: Negative for ear pain and sore throat.   Eyes: Negative for pain and visual disturbance.  Respiratory: Negative for cough and shortness of breath.   Cardiovascular: Negative for chest pain and palpitations.  Gastrointestinal: Positive for abdominal pain and nausea. Negative for anorexia, constipation, diarrhea, melena and vomiting.  Genitourinary: Negative for dysuria and hematuria.  Musculoskeletal: Negative for arthralgias and back pain.  Skin: Negative for color change and rash.  Neurological: Negative for seizures and syncope.  All other systems reviewed and are negative.   Physical  Exam Updated Vital Signs  ED Triage Vitals [05/05/20 0718]  Enc Vitals Group     BP 113/72     Pulse Rate (!) 59     Resp 16     Temp 98 F (36.7 C)     Temp src      SpO2 99 %     Weight      Height      Head Circumference      Peak Flow      Pain Score 10     Pain Loc      Pain Edu?      Excl. in [place]?     Physical Exam Vitals and nursing note reviewed.  Constitutional:      Appearance: He is well-developed.  HENT:     Head: Normocephalic and atraumatic.     Mouth/Throat:     Mouth: Mucous membranes are moist.  Eyes:     Extraocular Movements: Extraocular movements intact.     Conjunctiva/sclera: Conjunctivae normal.     Pupils: Pupils are equal, round, and reactive to light.  Cardiovascular:     Rate and Rhythm: Normal rate and regular rhythm.     Heart sounds: No murmur heard.   Pulmonary:     Effort: Pulmonary effort is normal. No respiratory distress.     Breath sounds: Normal breath sounds.  Abdominal:     Palpations: Abdomen is soft.     Tenderness: There is no abdominal tenderness. There is no right CVA tenderness, left CVA tenderness, guarding or rebound. Negative signs include Murphy's sign, Rovsing's sign, McBurney's sign and psoas sign.  Musculoskeletal:     Cervical back: Neck supple.  Skin:    General: Skin is warm and dry.  Neurological:     Mental Status: He is alert.     ED Results / Procedures / Treatments   Labs (all labs ordered are listed, but only abnormal results are displayed) Labs Reviewed - No data to display  EKG None  Radiology No results found.  Procedures Procedures (including critical care time)  Medications Ordered in ED Medications  alum & mag hydroxide-simeth (MAALOX/MYLANTA) 200-200-20 MG/5ML suspension 30 mL (has no administration in time range)    And  lidocaine (XYLOCAINE) 2 % viscous mouth solution 15 mL (has no administration in time range)  ondansetron (ZOFRAN-ODT) disintegrating tablet 4 mg (has no  administration in time range)    ED Course  I have reviewed the triage vital signs and the nursing notes.  Pertinent labs & imaging results that were available during my care of the patient were reviewed by me and considered in my medical decision making (see chart for details).    MDM Rules/Calculators/A&P  Brian Tate is a 29 year old male with history of gastritis who presents to the ED with intermittent epigastric pain.  Normal vitals.  No fever.  Similar symptoms to the past.  Does not take any of his medications anymore.  Does not have primary care doctor.  Denies any alcohol use.  States mostly crampy pain at times when eating certain foods.  Patient denies any heavy alcohol use ibuprofen use.  Denies black or bloody stools.  Patient overall appears well.  No severe abdominal pain at this time.  Has a crampy type feeling.  No particular tenderness on abdominal exam.  Mostly has a acid taste/burning type pain.  Pain does not radiate into the back.  Given history and exam have no concern for pancreatitis or cholecystitis or acute intra-abdominal process at this time.  Symptoms appear consistent with his chronic reflux and gastritis.  He understands to return if he ever has any black or bloody stools.  We will have him follow-up with a primary care doctor.  We will start him on Protonix, Carafate, Zofran.  Given GI cocktail and Zofran here with improvement.  Understands return precautions and discharged from ED in good condition.  This chart was dictated using voice recognition software.  Despite best efforts to proofread,  errors can occur which can change the documentation meaning.    Final Clinical Impression(s) / ED Diagnoses Final diagnoses:  Epigastric pain    Rx / DC Orders ED Discharge Orders         Ordered    pantoprazole (PROTONIX) 20 MG tablet  Daily     Discontinue  Reprint     05/05/20 0731    ondansetron (ZOFRAN) 4 MG tablet  Every 8 hours  PRN     Discontinue  Reprint     05/05/20 0731    sucralfate (CARAFATE) 1 g tablet  3 times daily with meals & bedtime     Discontinue  Reprint     05/05/20 0731           Virgina Norfolk, DO 05/05/20 [phone]

## 2022-06-13 ENCOUNTER — Other Ambulatory Visit: Payer: Self-pay

## 2022-06-13 ENCOUNTER — Encounter (HOSPITAL_BASED_OUTPATIENT_CLINIC_OR_DEPARTMENT_OTHER): Payer: Self-pay | Admitting: Emergency Medicine

## 2022-06-13 ENCOUNTER — Emergency Department (HOSPITAL_BASED_OUTPATIENT_CLINIC_OR_DEPARTMENT_OTHER)
Admission: EM | Admit: 2022-06-13 | Discharge: 2022-06-13 | Disposition: A | Discharge status: Home / Self Care | Payer: Self-pay | Attending: Emergency Medicine | Admitting: Emergency Medicine

## 2022-06-13 ENCOUNTER — Emergency Department (HOSPITAL_BASED_OUTPATIENT_CLINIC_OR_DEPARTMENT_OTHER): Payer: Self-pay

## 2022-06-13 DIAGNOSIS — W232XXA Caught, crushed, jammed or pinched between a moving and stationary object, initial encounter: Secondary | ICD-10-CM | POA: Insufficient documentation

## 2022-06-13 DIAGNOSIS — S9032XA Contusion of left foot, initial encounter: Secondary | ICD-10-CM | POA: Insufficient documentation

## 2022-06-13 MED ORDER — IBUPROFEN 800 MG PO TABS: 800.0000 mg | ORAL_TABLET | Freq: Three times a day (TID) | ORAL | 0 refills | Status: DC | PRN

## 2022-06-13 NOTE — ED Provider Notes (Signed)
[facility] EMERGENCY DEPARTMENT Provider Note   CSN: [id number] Arrival date & time: 06/13/22  5732     History  Chief Complaint  Patient presents with   Foot Injury    Brian Tate is a 31 y.o. male.  He is here with complaint of left great toe and left second toe injury that occurred yesterday at work.  He had his foot crushed between the lift gate and a wall of a truck.  Since then has had significant pain especially with ambulating.  No hip knee or ankle pain.  States the great toe does feel numb.  The history is provided by the patient.  Foot Injury Location:  Toe Time since incident:  1 day Injury: yes   Mechanism of injury: crush   Crush:    Mechanism:  Motor vehicle Toe location:  L great toe and L second toe Pain details:    Quality:  Throbbing   Onset quality:  Sudden   Timing:  Constant   Progression:  Unchanged Chronicity:  New Relieved by:  Nothing Worsened by:  Bearing weight Ineffective treatments:  Ice Associated symptoms: numbness and swelling   Associated symptoms: no fever        Home Medications Prior to Admission medications   Medication Sig Start Date End Date Taking? Authorizing Provider  acetaminophen (TYLENOL) 500 MG tablet Take 1,000 mg by mouth every 6 (six) hours as needed for mild pain, moderate pain or headache.    [provider]  dicyclomine (BENTYL) 20 MG tablet Take 1 tablet (20 mg total) by mouth 2 (two) times daily. 08/12/18   Maxwell Caul, PA-C  metoCLOPramide (REGLAN) 10 MG tablet Take 1 tablet (10 mg total) by mouth 4 (four) times daily -  before meals and at bedtime. 02/23/19   Arby Barrette, MD  omeprazole (PRILOSEC) 20 MG capsule Take 1 capsule (20 mg total) by mouth daily. 02/23/19   Arby Barrette, MD  ondansetron (ZOFRAN ODT) 4 MG disintegrating tablet Take 1 tablet (4 mg total) by mouth every 8 (eight) hours as needed for nausea or vomiting. 09/12/15   Hedges, Tinnie Gens, PA-C  ondansetron (ZOFRAN  ODT) 4 MG disintegrating tablet Take 1 tablet (4 mg total) by mouth every 4 (four) hours as needed for nausea or vomiting. 02/23/19   Arby Barrette, MD  ondansetron (ZOFRAN) 4 MG tablet Take 1 tablet (4 mg total) by mouth every 8 (eight) hours as needed for up to 20 doses for nausea or vomiting. 05/05/20   Curatolo, Adam, DO  pantoprazole (PROTONIX) 20 MG tablet Take 1 tablet (20 mg total) by mouth daily. 05/05/20 06/04/20  Curatolo, Adam, DO  promethazine (PHENERGAN) 25 MG tablet Take 1 tablet (25 mg total) by mouth every 6 (six) hours as needed for nausea or vomiting. 02/12/18   Ward, Layla Maw, DO  sucralfate (CARAFATE) 1 g tablet Take 1 tablet (1 g total) by mouth 4 (four) times daily -  with meals and at bedtime for 14 days. 05/05/20 05/19/20  Virgina Norfolk, DO      Allergies    Patient has no known allergies.    Review of Systems   Review of Systems  Constitutional:  Negative for fever.  Skin:  Negative for wound.  Neurological:  Positive for numbness.    Physical Exam Updated Vital Signs BP (!) 154/84   Pulse 84   Temp 98 F (36.7 C) (Oral)   Resp 17   SpO2 100%  Physical Exam Vitals and  nursing note reviewed.  Constitutional:      Appearance: Normal appearance. He is well-developed.  HENT:     Head: Normocephalic and atraumatic.  Eyes:     Conjunctiva/sclera: Conjunctivae normal.  Pulmonary:     Effort: Pulmonary effort is normal.  Musculoskeletal:        General: Tenderness and signs of injury present.     Cervical back: Neck supple.     Comments: Left lower extremity nontender hip knee and ankle.  Midfoot nontender.  Markedly tender at left great toe especially at his PIP and distal.  Nail intact without hemorrhage.  There is some toe ecchymosis and swelling.  Subjective decrease sensation.  Other digits normal.  No open wounds  Skin:    General: Skin is warm and dry.  Neurological:     General: No focal deficit present.     Mental Status: He is alert.     GCS: GCS  eye subscore is 4. GCS verbal subscore is 5. GCS motor subscore is 6.     ED Results / Procedures / Treatments   Labs (all labs ordered are listed, but only abnormal results are displayed) Labs Reviewed - No data to display  EKG None  Radiology DG Foot Complete Left  Result Date: 06/13/2022 CLINICAL DATA:  Great toe pain. Left foot caught in live gate of truck yesterday. EXAM: LEFT FOOT - COMPLETE 3+ VIEW COMPARISON:  None Available. FINDINGS: No signs of acute fracture or dislocation. There is mild soft tissue swelling about the great toe. No significant arthropathy identified. IMPRESSION: 1. No acute bone abnormality. 2. Mild soft tissue swelling about the great toe. Electronically Signed   By: Signa Kell M.D.   On: 06/13/2022 09:10    Procedures Procedures    Medications Ordered in ED Medications - No data to display  ED Course/ Medical Decision Making/ A&P Clinical Course as of 06/13/22 1739  Sat Jun 13, 2022  0857 Left foot x-ray interpreted by me as no acute fracture or dislocation.  Awaiting radiology reading. [MB]    Clinical Course User Index [MB] Terrilee Files, MD                           Medical Decision Making Amount and/or Complexity of Data Reviewed Radiology: ordered.  Risk Prescription drug management.   Differential includes fracture, dislocation, contusion, nailbed injury, subungual hematoma.  X-rays ordered interpreted by me as no acute fracture.  Will place in postoperative shoe and recommend NSAID, ice.  Return instructions discussed        Final Clinical Impression(s) / ED Diagnoses Final diagnoses:  Contusion of left foot, initial encounter    Rx / DC Orders ED Discharge Orders          Ordered    ibuprofen (ADVIL) 800 MG tablet  Every 8 hours PRN        06/13/22 0914              Terrilee Files, MD 06/13/22 1740

## 2022-06-13 NOTE — Discharge Instructions (Signed)
You were seen in the emergency department for evaluation of crush to your left great toe.  Your x-ray did not show any obvious fracture or dislocation.  Please use the hard soled shoe to help with ambulating.  Ice the area.  Ibuprofen for pain.  Return to the emergency department if any worsening or concerning symptoms.

## 2022-06-13 NOTE — ED Triage Notes (Signed)
Pt reports L foot got caught in the lift gate of a truck yesterday. Pt having pain worst in L great toe with some pain in toe next to it. Pt ambulatory to room.

## 2022-06-13 NOTE — ED Notes (Signed)
Pt discharged to home. Discharge instructions have been discussed with patient and/or family members. Pt verbally acknowledges understanding d/c instructions, and endorses comprehension to checkout at registration before leaving.  °

## 2022-10-19 ENCOUNTER — Other Ambulatory Visit: Payer: Self-pay

## 2022-10-19 ENCOUNTER — Encounter (HOSPITAL_BASED_OUTPATIENT_CLINIC_OR_DEPARTMENT_OTHER): Payer: Self-pay | Admitting: Emergency Medicine

## 2022-10-19 ENCOUNTER — Emergency Department (HOSPITAL_BASED_OUTPATIENT_CLINIC_OR_DEPARTMENT_OTHER)
Admission: EM | Admit: 2022-10-19 | Discharge: 2022-10-19 | Disposition: A | Discharge status: Home / Self Care | Payer: Self-pay | Attending: Emergency Medicine | Admitting: Emergency Medicine

## 2022-10-19 DIAGNOSIS — R1013 Epigastric pain: Secondary | ICD-10-CM | POA: Insufficient documentation

## 2022-10-19 DIAGNOSIS — R197 Diarrhea, unspecified: Secondary | ICD-10-CM | POA: Insufficient documentation

## 2022-10-19 DIAGNOSIS — F17210 Nicotine dependence, cigarettes, uncomplicated: Secondary | ICD-10-CM | POA: Insufficient documentation

## 2022-10-19 DIAGNOSIS — R1011 Right upper quadrant pain: Secondary | ICD-10-CM | POA: Insufficient documentation

## 2022-10-19 DIAGNOSIS — R112 Nausea with vomiting, unspecified: Secondary | ICD-10-CM | POA: Insufficient documentation

## 2022-10-19 MED ORDER — OXYCODONE-ACETAMINOPHEN 5-325 MG PO TABS: 1.0000 | ORAL_TABLET | ORAL | 0 refills | Status: AC | PRN

## 2022-10-19 MED ORDER — SODIUM CHLORIDE 0.9 % IV BOLUS
1000.0000 mL | Freq: Once | INTRAVENOUS | Status: AC
  Administered 2022-10-19: 1000 mL via INTRAVENOUS

## 2022-10-19 MED ORDER — ONDANSETRON HCL 4 MG/2ML IJ SOLN
4.0000 mg | Freq: Once | INTRAMUSCULAR | Status: AC
  Administered 2022-10-19: 4 mg via INTRAVENOUS
  Filled 2022-10-19: qty 2

## 2022-10-19 MED ORDER — HYDROMORPHONE HCL 1 MG/ML IJ SOLN
1.0000 mg | Freq: Once | INTRAMUSCULAR | Status: AC
  Administered 2022-10-19: 1 mg via INTRAVENOUS
  Filled 2022-10-19: qty 1

## 2022-10-19 MED ORDER — ONDANSETRON 8 MG PO TBDP: 8.0000 mg | ORAL_TABLET | Freq: Three times a day (TID) | ORAL | 1 refills | Status: AC | PRN

## 2022-10-19 NOTE — ED Triage Notes (Signed)
LUQ pain that radiates to back. Pt seen for same at another ED yesterday. Pt states "its my gall bladder". Reports vomiting, and diaphoresis, and says "this has been going on for about 20 years". Denies fever. One episode of emesis tonight. Pt states pain has been intermittent for a week "every 30 minutes".

## 2022-10-19 NOTE — ED Provider Notes (Signed)
[facility] Provider Note: Brian Dell, MD, FACEP  CSN: [id number] MRN: [id number] ARRIVAL: 10/19/22 at 0031 ROOM: MH02/[facility]02   CHIEF COMPLAINT  Abdominal Pain   HISTORY OF PRESENT ILLNESS  10/19/22 12:58 AM Brian Tate is a 31 y.o. male who was seen at [facility] two days ago for right upper quadrant and epigastric abdominal pain that radiates to his back and into his right lower quadrant.  He had some associated nausea and vomiting.  He also has some diarrhea but that is chronic for him.  Laboratory studies were unremarkable and CT scan of the abdomen and pelvis showed no significant abnormality.  He was discharged with a prescription for Bentyl.  He states he has been having episodes of this right upper quadrant pain for the last 20 years.  They tend to occur for several days, multiple times a day (about every 30 minutes) and then he will have reprieves.  He believes that it is his gallbladder but his most recent gallbladder ultrasound in June of this year was negative for gallstones.  To his knowledge she has never had a HIDA scan.  He currently rates his pain as a 10 out of 10 and describes it as both dull and sharp.  It is worse with palpation and has been associated with 1 episode of vomiting earlier this evening.   Past Medical History:  Diagnosis Date   Gastritis    Pancreatitis     History reviewed. No pertinent surgical history.  History reviewed. No pertinent family history.  Social History   Tobacco Use   Smoking status: Every Day    Packs/day: 0.50    Types: Cigarettes   Smokeless tobacco: Never  Vaping Use   Vaping Use: Never used  Substance Use Topics   Alcohol use: No   Drug use: Yes    Types: Marijuana    Comment: daily    Prior to Admission medications   Medication Sig Start Date End Date Taking? Authorizing Provider  ondansetron (ZOFRAN-ODT) 8 MG disintegrating tablet Take 1 tablet (8 mg total) by mouth every 8 (eight)  hours as needed for nausea or vomiting. 10/19/22  Yes Darian Ace, MD  oxyCODONE-acetaminophen (PERCOCET) 5-325 MG tablet Take 1 tablet by mouth every 4 (four) hours as needed for severe pain. 10/19/22  Yes Muneer Leider, MD    Allergies Patient has no known allergies.   REVIEW OF SYSTEMS  Negative except as noted here or in the History of Present Illness.   PHYSICAL EXAMINATION  Initial Vital Signs Blood pressure 125/75, pulse 74, temperature 98.2 F (36.8 C), temperature source Oral, resp. rate 18, height 6\' 3"  (1.905 m), weight 68 kg, SpO2 100 %.  Examination General: Well-developed, thin male in no acute distress; appearance consistent with age of record HENT: normocephalic; atraumatic Eyes: Normal appearance Neck: supple Heart: regular rate and rhythm Lungs: clear to auscultation bilaterally Abdomen: soft; nondistended; right upper quadrant tenderness; bowel sounds present Extremities: No deformity; full range of motion; pulses normal Neurologic: Awake, alert and oriented; motor function intact in all extremities and symmetric; no facial droop Skin: Warm and dry Psychiatric: Writhing; grimacing   RESULTS  Summary of this visit's results, reviewed and interpreted by myself:   EKG Interpretation  Date/Time:    Ventricular Rate:    PR Interval:    QRS Duration:   QT Interval:    QTC Calculation:   R Axis:     Text Interpretation:  Laboratory Studies: No results found for this or any previous visit (from the past 24 hour(s)). Imaging Studies: No results found.  ED COURSE and MDM  Nursing notes, initial and subsequent vitals signs, including pulse oximetry, reviewed and interpreted by myself.  Vitals:   10/19/22 0043 10/19/22 0044  BP: 125/75   Pulse: 74   Resp: 18   Temp: 98.2 F (36.8 C)   TempSrc: Oral   SpO2: 100%   Weight:  68 kg  Height:  6\' 3"  (1.905 m)   Medications  sodium chloride 0.9 % bolus 1,000 mL (1,000 mLs Intravenous New  Bag/Given 10/19/22 0127)  ondansetron (ZOFRAN) injection 4 mg (4 mg Intravenous Given 10/19/22 0125)  HYDROmorphone (DILAUDID) injection 1 mg (1 mg Intravenous Given 10/19/22 0126)   2:00 AM Pain well-controlled at this time.  Given that his imaging studies have been nondiagnostic I suspect he may have a malfunctioning gallbladder.  He may benefit from a HIDA scan which we cannot obtain in the ED.  We will refer him to [facility] surgery for further evaluation and consideration of elective cholecystectomy.  They may want to order an outpatient HIDA scan as part of their workup.  The pattern of his pain is not consistent with cannabis associated hyperemesis syndrome.   PROCEDURES  Procedures   ED DIAGNOSES     ICD-10-CM   1. Right upper quadrant abdominal pain  R10.11          Yanitza Shvartsman, [facility], MD 10/19/22 14/11/23
